# Patient Record
Sex: Male | Born: 1985 | Race: White | Hispanic: No | Marital: Single | Smoking: Current every day smoker
Health system: Southern US, Community
[De-identification: ages and names within clinical notes are randomized; demographics above are authoritative.]

## PROBLEM LIST (undated history)

## (undated) DIAGNOSIS — F6381 Intermittent explosive disorder: Secondary | ICD-10-CM

---

## 2008-07-19 ENCOUNTER — Emergency Department (HOSPITAL_COMMUNITY): Admission: EM | Admit: 2008-07-19 | Discharge: 2008-07-19 | Payer: Self-pay | Admitting: Emergency Medicine

## 2008-10-20 ENCOUNTER — Emergency Department (HOSPITAL_COMMUNITY): Admission: EM | Admit: 2008-10-20 | Discharge: 2008-10-20 | Payer: Self-pay | Admitting: Emergency Medicine

## 2009-03-27 ENCOUNTER — Emergency Department (HOSPITAL_COMMUNITY): Admission: EM | Admit: 2009-03-27 | Discharge: 2009-03-27 | Payer: Self-pay | Admitting: Emergency Medicine

## 2010-02-01 ENCOUNTER — Emergency Department (HOSPITAL_COMMUNITY): Admission: EM | Admit: 2010-02-01 | Discharge: 2010-02-01 | Payer: Self-pay | Admitting: Emergency Medicine

## 2010-06-07 ENCOUNTER — Emergency Department (HOSPITAL_COMMUNITY): Admission: EM | Admit: 2010-06-07 | Discharge: 2010-06-07 | Payer: Self-pay | Admitting: Emergency Medicine

## 2010-06-13 ENCOUNTER — Emergency Department (HOSPITAL_COMMUNITY): Admission: EM | Admit: 2010-06-13 | Discharge: 2010-06-13 | Payer: Self-pay | Admitting: Emergency Medicine

## 2010-08-03 ENCOUNTER — Emergency Department (HOSPITAL_COMMUNITY): Admission: EM | Admit: 2010-08-03 | Discharge: 2010-08-03 | Payer: Self-pay | Admitting: Emergency Medicine

## 2010-09-17 ENCOUNTER — Emergency Department (HOSPITAL_COMMUNITY): Admission: EM | Admit: 2010-09-17 | Discharge: 2010-09-17 | Payer: Self-pay | Admitting: Emergency Medicine

## 2010-10-08 ENCOUNTER — Emergency Department (HOSPITAL_COMMUNITY)
Admission: EM | Admit: 2010-10-08 | Discharge: 2010-10-08 | Payer: Self-pay | Source: Home / Self Care | Admitting: Emergency Medicine

## 2010-11-16 ENCOUNTER — Emergency Department (HOSPITAL_COMMUNITY)
Admission: EM | Admit: 2010-11-16 | Discharge: 2010-11-16 | Payer: Self-pay | Source: Home / Self Care | Admitting: Emergency Medicine

## 2010-11-29 ENCOUNTER — Ambulatory Visit (HOSPITAL_COMMUNITY)
Admission: RE | Admit: 2010-11-29 | Discharge: 2010-11-29 | Disposition: A | Discharge status: Home / Self Care | Payer: Self-pay | Source: Ambulatory Visit | Attending: Orthopedic Surgery | Admitting: Orthopedic Surgery

## 2010-11-29 DIAGNOSIS — X58XXXA Exposure to other specified factors, initial encounter: Secondary | ICD-10-CM | POA: Insufficient documentation

## 2010-11-29 DIAGNOSIS — S99919A Unspecified injury of unspecified ankle, initial encounter: Secondary | ICD-10-CM | POA: Insufficient documentation

## 2010-11-29 DIAGNOSIS — S8990XA Unspecified injury of unspecified lower leg, initial encounter: Secondary | ICD-10-CM | POA: Insufficient documentation

## 2010-11-29 DIAGNOSIS — M79609 Pain in unspecified limb: Secondary | ICD-10-CM | POA: Insufficient documentation

## 2010-12-01 ENCOUNTER — Other Ambulatory Visit (HOSPITAL_COMMUNITY): Payer: Self-pay | Admitting: Orthopedic Surgery

## 2010-12-01 DIAGNOSIS — M6740 Ganglion, unspecified site: Secondary | ICD-10-CM

## 2010-12-01 DIAGNOSIS — M79672 Pain in left foot: Secondary | ICD-10-CM

## 2010-12-06 ENCOUNTER — Ambulatory Visit (HOSPITAL_COMMUNITY)
Admission: RE | Admit: 2010-12-06 | Discharge: 2010-12-06 | Disposition: A | Discharge status: Home / Self Care | Payer: Self-pay | Source: Ambulatory Visit | Attending: Orthopedic Surgery | Admitting: Orthopedic Surgery

## 2010-12-06 DIAGNOSIS — M79672 Pain in left foot: Secondary | ICD-10-CM

## 2010-12-06 DIAGNOSIS — M6740 Ganglion, unspecified site: Secondary | ICD-10-CM

## 2010-12-06 DIAGNOSIS — R262 Difficulty in walking, not elsewhere classified: Secondary | ICD-10-CM | POA: Insufficient documentation

## 2010-12-06 DIAGNOSIS — M899 Disorder of bone, unspecified: Secondary | ICD-10-CM | POA: Insufficient documentation

## 2010-12-06 DIAGNOSIS — M79609 Pain in unspecified limb: Secondary | ICD-10-CM | POA: Insufficient documentation

## 2011-01-10 ENCOUNTER — Ambulatory Visit: Payer: Self-pay | Admitting: Family Medicine

## 2011-02-03 ENCOUNTER — Emergency Department (HOSPITAL_COMMUNITY)
Admission: EM | Admit: 2011-02-03 | Discharge: 2011-02-03 | Disposition: A | Discharge status: Home / Self Care | Payer: No Typology Code available for payment source | Attending: Emergency Medicine | Admitting: Emergency Medicine

## 2011-02-03 DIAGNOSIS — M545 Low back pain, unspecified: Secondary | ICD-10-CM | POA: Insufficient documentation

## 2011-02-03 DIAGNOSIS — S0990XA Unspecified injury of head, initial encounter: Secondary | ICD-10-CM | POA: Insufficient documentation

## 2011-02-03 DIAGNOSIS — R42 Dizziness and giddiness: Secondary | ICD-10-CM | POA: Insufficient documentation

## 2011-02-18 ENCOUNTER — Emergency Department (HOSPITAL_COMMUNITY)
Admission: EM | Admit: 2011-02-18 | Discharge: 2011-02-18 | Disposition: A | Discharge status: Home / Self Care | Payer: Self-pay | Attending: Emergency Medicine | Admitting: Emergency Medicine

## 2011-02-18 ENCOUNTER — Emergency Department (HOSPITAL_COMMUNITY): Payer: Self-pay

## 2011-02-18 DIAGNOSIS — S93409A Sprain of unspecified ligament of unspecified ankle, initial encounter: Secondary | ICD-10-CM | POA: Insufficient documentation

## 2011-02-18 DIAGNOSIS — X500XXA Overexertion from strenuous movement or load, initial encounter: Secondary | ICD-10-CM | POA: Insufficient documentation

## 2011-04-02 ENCOUNTER — Emergency Department (HOSPITAL_COMMUNITY)
Admission: EM | Admit: 2011-04-02 | Discharge: 2011-04-03 | Disposition: A | Discharge status: Home / Self Care | Payer: No Typology Code available for payment source | Attending: Emergency Medicine | Admitting: Emergency Medicine

## 2011-04-02 ENCOUNTER — Emergency Department (HOSPITAL_COMMUNITY): Payer: Self-pay

## 2011-04-02 DIAGNOSIS — M7989 Other specified soft tissue disorders: Secondary | ICD-10-CM | POA: Insufficient documentation

## 2011-04-02 DIAGNOSIS — S6990XA Unspecified injury of unspecified wrist, hand and finger(s), initial encounter: Secondary | ICD-10-CM | POA: Insufficient documentation

## 2011-04-02 DIAGNOSIS — W230XXA Caught, crushed, jammed, or pinched between moving objects, initial encounter: Secondary | ICD-10-CM | POA: Insufficient documentation

## 2011-04-02 DIAGNOSIS — S6390XA Sprain of unspecified part of unspecified wrist and hand, initial encounter: Secondary | ICD-10-CM | POA: Insufficient documentation

## 2011-04-02 DIAGNOSIS — M79609 Pain in unspecified limb: Secondary | ICD-10-CM | POA: Insufficient documentation

## 2011-04-02 DIAGNOSIS — K029 Dental caries, unspecified: Secondary | ICD-10-CM | POA: Insufficient documentation

## 2011-04-02 DIAGNOSIS — Y92009 Unspecified place in unspecified non-institutional (private) residence as the place of occurrence of the external cause: Secondary | ICD-10-CM | POA: Insufficient documentation

## 2011-05-24 ENCOUNTER — Emergency Department (HOSPITAL_COMMUNITY)
Admission: EM | Admit: 2011-05-24 | Discharge: 2011-05-25 | Disposition: A | Discharge status: Home / Self Care | Payer: Self-pay | Attending: Emergency Medicine | Admitting: Emergency Medicine

## 2011-05-24 DIAGNOSIS — R142 Eructation: Secondary | ICD-10-CM | POA: Insufficient documentation

## 2011-05-24 DIAGNOSIS — R109 Unspecified abdominal pain: Secondary | ICD-10-CM | POA: Insufficient documentation

## 2011-05-24 DIAGNOSIS — R141 Gas pain: Secondary | ICD-10-CM | POA: Insufficient documentation

## 2011-05-24 DIAGNOSIS — R112 Nausea with vomiting, unspecified: Secondary | ICD-10-CM | POA: Insufficient documentation

## 2011-05-24 LAB — BASIC METABOLIC PANEL
BUN: 9 mg/dL (ref 6–23)
CO2: 30 mEq/L (ref 19–32)
Calcium: 9.8 mg/dL (ref 8.4–10.5)
Creatinine, Ser: 0.8 mg/dL (ref 0.50–1.35)
GFR calc non Af Amer: >60 mL/min (ref >60)
Glucose, Bld: 94 mg/dL (ref 70–99)
Sodium: 142 mEq/L (ref 135–145)

## 2011-05-24 LAB — DIFFERENTIAL
Basophils Relative: 0 % (ref 0–1)
Lymphs Abs: 3.1 10*3/uL (ref 0.7–4.0)
Monocytes Absolute: 1.2 10*3/uL — ABNORMAL HIGH (ref 0.1–1.0)
Monocytes Relative: 10 % (ref 3–12)
Neutro Abs: 7.3 10*3/uL (ref 1.7–7.7)

## 2011-05-24 LAB — CBC
Hemoglobin: 14.5 g/dL (ref 13.0–17.0)
MCH: 27.3 pg (ref 26.0–34.0)
MCHC: 34.4 g/dL (ref 30.0–36.0)
MCV: 79.5 fL (ref 78.0–100.0)
RBC: 5.31 MIL/uL (ref 4.22–5.81)

## 2011-05-25 ENCOUNTER — Emergency Department (HOSPITAL_COMMUNITY): Payer: Self-pay

## 2011-05-25 LAB — URINALYSIS, ROUTINE W REFLEX MICROSCOPIC
Bilirubin Urine: NEGATIVE
Glucose, UA: NEGATIVE mg/dL
Hgb urine dipstick: NEGATIVE
Ketones, ur: NEGATIVE mg/dL
Protein, ur: NEGATIVE mg/dL
Urobilinogen, UA: 1 mg/dL (ref 0.0–1.0)

## 2011-05-25 MED ORDER — IOHEXOL 300 MG/ML  SOLN
100.0000 mL | Freq: Once | INTRAMUSCULAR | Status: AC | PRN
  Administered 2011-05-25: 100 mL via INTRAVENOUS

## 2011-07-28 LAB — URINE MICROSCOPIC-ADD ON

## 2011-07-28 LAB — URINALYSIS, ROUTINE W REFLEX MICROSCOPIC
Bilirubin Urine: NEGATIVE
Glucose, UA: NEGATIVE mg/dL
Ketones, ur: NEGATIVE mg/dL
Leukocytes, UA: NEGATIVE
Protein, ur: 30 mg/dL — AB
pH: 6.5 (ref 5.0–8.0)

## 2012-01-22 ENCOUNTER — Encounter (HOSPITAL_COMMUNITY): Payer: Self-pay | Admitting: *Deleted

## 2012-01-22 ENCOUNTER — Emergency Department (HOSPITAL_COMMUNITY): Payer: Self-pay

## 2012-01-22 ENCOUNTER — Emergency Department (HOSPITAL_COMMUNITY)
Admission: EM | Admit: 2012-01-22 | Discharge: 2012-01-22 | Disposition: A | Discharge status: Home / Self Care | Payer: Self-pay | Attending: Internal Medicine | Admitting: Internal Medicine

## 2012-01-22 DIAGNOSIS — M549 Dorsalgia, unspecified: Secondary | ICD-10-CM | POA: Insufficient documentation

## 2012-01-22 DIAGNOSIS — M62838 Other muscle spasm: Secondary | ICD-10-CM | POA: Insufficient documentation

## 2012-01-22 DIAGNOSIS — M25519 Pain in unspecified shoulder: Secondary | ICD-10-CM | POA: Insufficient documentation

## 2012-01-22 MED ORDER — IBUPROFEN 800 MG PO TABS
800.0000 mg | ORAL_TABLET | Freq: Once | ORAL | Status: AC
  Administered 2012-01-22: 800 mg via ORAL
  Filled 2012-01-22: qty 1

## 2012-01-22 MED ORDER — CYCLOBENZAPRINE HCL 10 MG PO TABS: 10.0000 mg | ORAL_TABLET | Freq: Two times a day (BID) | ORAL | Status: AC | PRN

## 2012-01-22 MED ORDER — IBUPROFEN 800 MG PO TABS: 800.0000 mg | ORAL_TABLET | Freq: Three times a day (TID) | ORAL | Status: AC

## 2012-01-22 MED ORDER — CYCLOBENZAPRINE HCL 10 MG PO TABS
10.0000 mg | ORAL_TABLET | Freq: Once | ORAL | Status: AC
  Administered 2012-01-22: 10 mg via ORAL
  Filled 2012-01-22: qty 1

## 2012-01-22 NOTE — ED Provider Notes (Signed)
History     CSN: [id number]  Arrival date & time 01/22/12  1646   None     Chief Complaint  Patient presents with  . Shoulder Pain    (Consider location/radiation/quality/duration/timing/severity/associated sxs/prior treatment) HPI  History reviewed. No pertinent past medical history.  History reviewed. No pertinent past surgical history.  No family history on file.  History  Substance Use Topics  . Smoking status: Current Everyday Smoker  . Smokeless tobacco: Not on file  . Alcohol Use: Yes      Review of Systems  Allergies  Review of patient's allergies indicates no known allergies.  Home Medications  No current outpatient prescriptions on file.  BP 139/83  Pulse 79  Temp(Src) 98.3 F (36.8 C) (Oral)  Resp 18  SpO2 98%  Physical Exam  ED Course  Procedures (including critical care time)  Labs Reviewed - No data to display No results found.   No diagnosis found.           Toy Baker, MD 01/24/12 [phone]

## 2012-01-22 NOTE — ED Notes (Signed)
Pt stated that he has been having left shoulder and back pain x 2 days. Pt is tender to touch in back area. No redness or swelling.  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 No respiratory or cardiac distress. Will continue to monitor.

## 2012-01-22 NOTE — Discharge Instructions (Signed)
Mr Kamara the x-ray of your shoulder did not show any acute fracture. We gave you a muscle relaxer or an ibuprofen in the ER. You can also use ice to the area x 24 hours intermittantly.  It PCP from the list below to followup with or get one on your own.  Return to the ER for severe pain or severe shortness of breath.  Back Pain, Adult Back pain is very common. The pain often gets better over time. The cause of back pain is usually not dangerous. Most people can learn to manage their back pain on their own.  HOME CARE   Stay active. Start with short walks on flat ground if you can. Try to walk farther each day.   Do not sit, drive, or stand in one place for more than 30 minutes. Do not stay in bed.   Do not avoid exercise or work. Activity can help your back heal faster.   Be careful when you bend or lift an object. Bend at your knees, keep the object close to you, and do not twist.   Sleep on a firm mattress. Lie on your side, and bend your knees. If you lie on your back, put a pillow under your knees.   Only take medicines as told by your doctor.   Put ice on the injured area.   Put ice in a plastic bag.   Place a towel between your skin and the bag.   Leave the ice on for 15 to 20 minutes, 3 to 4 times a day for the first 2 to 3 days. After that, you can switch between ice and heat packs.   Ask your doctor about back exercises or massage.   Avoid feeling anxious or stressed. Find good ways to deal with stress, such as exercise.  GET HELP RIGHT AWAY IF:   Your pain does not go away with rest or medicine.   Your pain does not go away in 1 week.   You have new problems.   You do not feel well.   The pain spreads into your legs.   You cannot control when you poop (bowel movement) or pee (urinate).   Your arms or legs feel weak or lose feeling (numbness).   You feel sick to your stomach (nauseous) or throw up (vomit).   You have belly (abdominal) pain.   You feel like  you may pass out (faint).  MAKE SURE YOU:   Understand these instructions.   Will watch your condition.   Will get help right away if you are not doing well or get worse.  Document Released: 03/27/2008 Document Revised: 09/28/2011 Document Reviewed: 02/27/2011 [facility] Patient Information 2012 [place], [place].Muscle Cramps Muscle cramps are when muscles tighten by themselves. Muscle cramps usually improve or go away within minutes. HOME CARE  Massage the muscle.   Stretch the muscle.   Relax the muscle.   Only take medicine as told by your doctor.   Drink enough fluids to keep your pee (urine) clear or pale yellow.  GET HELP RIGHT AWAY IF:  Cramps are frequent and do not get better with medicine. MAKE SURE YOU:  Understand these intructions.   Will watch your condition.   Will get help right away if your are not doing well or get worse.  Document Released: 09/21/2008 Document Revised: 09/28/2011 Document Reviewed: 09/30/2008 [facility] Patient Information 2012 [place], [place].

## 2012-01-22 NOTE — ED Provider Notes (Signed)
History     CSN: [id number]  Arrival date & time 01/22/12  1646   None     Chief Complaint  Patient presents with  . Shoulder Pain    (Consider location/radiation/quality/duration/timing/severity/associated sxs/prior treatment) Patient is a 26 y.o. male presenting with shoulder pain. The history is provided by the patient. No language interpreter was used.  Shoulder Pain This is a new problem. The current episode started yesterday. The problem occurs constantly. The problem has been unchanged. Pertinent negatives include no chest pain, chills, coughing, fever, joint swelling, nausea, neck pain, numbness, rash, vomiting or weakness. The symptoms are aggravated by twisting and bending. He has tried acetaminophen for the symptoms. The treatment provided mild relief.  Reports that he awoke with L scapula pain yesterday and it is getting progressively worse.  Denies injury.  Mover for occupation.  Took tylenol without relief.  No fever or chest pain.  No pcp. No pmh.  Smoker.  History reviewed. No pertinent past medical history.  History reviewed. No pertinent past surgical history.  No family history on file.  History  Substance Use Topics  . Smoking status: Current Everyday Smoker  . Smokeless tobacco: Not on file  . Alcohol Use: Yes      Review of Systems  Constitutional: Negative.  Negative for fever and chills.  HENT: Negative.  Negative for neck pain.   Eyes: Negative.   Respiratory: Negative.  Negative for cough and shortness of breath.   Cardiovascular: Negative.  Negative for chest pain.  Gastrointestinal: Negative.  Negative for nausea and vomiting.  Musculoskeletal: Positive for back pain. Negative for joint swelling.  Skin: Negative for rash.  Neurological: Negative.  Negative for dizziness, weakness, light-headedness and numbness.  Psychiatric/Behavioral: Negative.     Allergies  Review of patient's allergies indicates no known allergies.  Home Medications    No current outpatient prescriptions on file.  BP 139/83  Pulse 79  Temp(Src) 98.3 F (36.8 C) (Oral)  Resp 18  SpO2 98%  Physical Exam  Nursing note and vitals reviewed. Constitutional: He is oriented to person, place, and time. He appears well-developed and well-nourished.  HENT:  Head: Normocephalic.  Eyes: Conjunctivae and EOM are normal. Pupils are equal, round, and reactive to light.  Neck: Normal range of motion. Neck supple.  Cardiovascular: Normal rate.   Pulmonary/Chest: Effort normal.  Abdominal: Soft.  Musculoskeletal: Normal range of motion. He exhibits tenderness.       Muscle spasm palpated to the L scapula  Neurological: He is alert and oriented to person, place, and time.  Skin: Skin is warm and dry.  Psychiatric: He has a normal mood and affect.    ED Course  Procedures (including critical care time)  Labs Reviewed - No data to display Dg Shoulder Left  01/22/2012  *RADIOLOGY REPORT*  Clinical Data: Left shoulder pain.  LEFT SHOULDER - 2+ VIEW  Comparison: None  Findings: The joint spaces are maintained.  No acute fracture.  No abnormal soft tissue calcifications.  The left lung apex is clear. The left upper ribs are intact.  IMPRESSION: No acute bony findings.  Original Report Authenticated By: P. Loralie Champagne, M.D.     No diagnosis found.    MDM  L scapula muscle spasm treated with ice,  ibuprofen and flexeril.  Rx for the same.  No pcp.  Pick pcp from list.  Instructed to Return if SOB or dizziness.         Jethro Bastos, NP 01/23/12  1023 

## 2012-01-22 NOTE — ED Notes (Signed)
The pt has lt posterior shoulder pain .  He woke up with it yesterday am with the pain.  No previous history.  No known injury

## 2012-01-24 NOTE — ED Provider Notes (Signed)
Medical screening examination/treatment/procedure(s) were performed by non-physician practitioner and as supervising physician I was immediately available for consultation/collaboration.   Shelda Jakes, MD 01/24/12 [phone]

## 2013-08-12 ENCOUNTER — Encounter (HOSPITAL_COMMUNITY): Payer: Self-pay | Admitting: Emergency Medicine

## 2013-08-12 ENCOUNTER — Emergency Department (HOSPITAL_COMMUNITY)
Admission: EM | Admit: 2013-08-12 | Discharge: 2013-08-12 | Disposition: A | Discharge status: Home / Self Care | Payer: Self-pay | Attending: Emergency Medicine | Admitting: Emergency Medicine

## 2013-08-12 ENCOUNTER — Emergency Department (HOSPITAL_COMMUNITY): Payer: Self-pay

## 2013-08-12 DIAGNOSIS — S60229A Contusion of unspecified hand, initial encounter: Secondary | ICD-10-CM | POA: Insufficient documentation

## 2013-08-12 DIAGNOSIS — S6000XA Contusion of unspecified finger without damage to nail, initial encounter: Secondary | ICD-10-CM | POA: Insufficient documentation

## 2013-08-12 DIAGNOSIS — Y9389 Activity, other specified: Secondary | ICD-10-CM | POA: Insufficient documentation

## 2013-08-12 DIAGNOSIS — Y9289 Other specified places as the place of occurrence of the external cause: Secondary | ICD-10-CM | POA: Insufficient documentation

## 2013-08-12 DIAGNOSIS — W010XXA Fall on same level from slipping, tripping and stumbling without subsequent striking against object, initial encounter: Secondary | ICD-10-CM | POA: Insufficient documentation

## 2013-08-12 DIAGNOSIS — F17200 Nicotine dependence, unspecified, uncomplicated: Secondary | ICD-10-CM | POA: Insufficient documentation

## 2013-08-12 MED ORDER — HYDROCODONE-ACETAMINOPHEN 5-325 MG PO TABS: 1.0000 | ORAL_TABLET | Freq: Four times a day (QID) | ORAL | Status: DC | PRN

## 2013-08-12 MED ORDER — IBUPROFEN 800 MG PO TABS: 800.0000 mg | ORAL_TABLET | Freq: Three times a day (TID) | ORAL | Status: DC | PRN

## 2013-08-12 NOTE — ED Provider Notes (Signed)
CSN: [id number]     Arrival date & time 08/12/13  1316 History  This chart was scribed for Tyler Ridge, PA-C working with Flint Melter, MD by Ashley Jacobs, ED scribe. This patient was seen in room WTR6/WTR6 and the patient's care was started at 1:35 PM.  First MD Initiated Contact with Patient 08/12/13 1318     Chief Complaint  Patient presents with  . Wrist Pain   (Consider location/radiation/quality/duration/timing/severity/associated sxs/prior Treatment) The history is provided by the patient and medical records. No language interpreter was used.   HPI Comments: Tyler Ryan is a 27 y.o. male who presents to the Emergency Department complaining of constant, moderate, right wrist pain that occurred yesterday after falling while taking the out garbage. Pt took Ibuprofen last night for pain but reports the pain has not resolved. He is also experiencing gradually worsening swelling in his left hand and abrasion on his 3-5th interphalangeal joints. He states this morning he was unable to grasp his coffee cup this morning. Pt denies weakness and numbness. His last tetanus shot was 2010. He does not have any known allergies or any previous medical complication. Pt smoke tobacco everyday and uses alcohol  No past medical history on file. No past surgical history on file. No family history on file. History  Substance Use Topics  . Smoking status: Current Every Day Smoker  . Smokeless tobacco: Not on file  . Alcohol Use: Yes    Review of Systems  Musculoskeletal: Positive for arthralgias and joint swelling.       Right wrist   Skin: Positive for wound (3-5th digit).  Neurological: Negative for weakness and numbness.    Allergies  Review of patient's allergies indicates no known allergies.  Home Medications   Current Outpatient Rx  Name  Route  Sig  Dispense  Refill  . ibuprofen (ADVIL,MOTRIN) 200 MG tablet   Oral   Take 400 mg by mouth every 6 (six) hours as needed for pain  (pain).         . Zinc Oxide 10 % OINT   Apply externally   Apply 1 application topically as needed (placed on abrasions).          BP 124/80  Pulse 90  Temp(Src) 98.5 F (36.9 C) (Oral)  SpO2 98% Physical Exam  Nursing note and vitals reviewed. Constitutional: He is oriented to person, place, and time. He appears well-developed and well-nourished.  Musculoskeletal:       Right wrist: He exhibits decreased range of motion, tenderness and bony tenderness. He exhibits no deformity.       Right hand: He exhibits decreased range of motion, tenderness and swelling. He exhibits normal two-point discrimination, normal capillary refill and no deformity. Normal sensation noted.       Hands: Neurological: He is alert and oriented to person, place, and time.  Skin: Skin is warm and dry.    ED Course  Procedures (including critical care time) DIAGNOSTIC STUDIES: Oxygen Saturation is 98% on room air, normal by my interpretation.    COORDINATION OF CARE: 1:39 PM Discussed course of care with pt which includes DG Complete R hand. Pt understands and agrees.  Patient does not have any fractures noted on x-ray.  Patient be referred to hand surgery for further evaluation and followup.  Patient be placed in a Watson-Jones dressing for protection and comfort   I personally performed the services described in this documentation, which was scribed in my presence. The recorded information has  been reviewed and is accurate.   Carlyle Dolly, PA-C 08/12/13 [phone]

## 2013-08-12 NOTE — Progress Notes (Signed)
[facility] provided pt with a list of primary care resources and a [facility] Card application.  °

## 2013-08-12 NOTE — ED Provider Notes (Signed)
Medical screening examination/treatment/procedure(s) were performed by non-physician practitioner and as supervising physician I was immediately available for consultation/collaboration.   Flint Melter, MD 08/12/13 [phone]

## 2013-08-12 NOTE — ED Notes (Signed)
Patient reports a trip and fall on 08/11/13, trying to break the fall with hands. C/o pain and swelling to right wrist.  patients denies LOC, dizziness or nausea.

## 2013-08-26 ENCOUNTER — Emergency Department (HOSPITAL_COMMUNITY)
Admission: EM | Admit: 2013-08-26 | Discharge: 2013-08-26 | Disposition: A | Discharge status: Home / Self Care | Payer: Self-pay | Attending: Emergency Medicine | Admitting: Emergency Medicine

## 2013-08-26 ENCOUNTER — Encounter (HOSPITAL_COMMUNITY): Payer: Self-pay | Admitting: Emergency Medicine

## 2013-08-26 ENCOUNTER — Emergency Department (HOSPITAL_COMMUNITY): Payer: Self-pay

## 2013-08-26 DIAGNOSIS — M25439 Effusion, unspecified wrist: Secondary | ICD-10-CM | POA: Insufficient documentation

## 2013-08-26 DIAGNOSIS — S6391XA Sprain of unspecified part of right wrist and hand, initial encounter: Secondary | ICD-10-CM

## 2013-08-26 DIAGNOSIS — S63501A Unspecified sprain of right wrist, initial encounter: Secondary | ICD-10-CM

## 2013-08-26 DIAGNOSIS — M25539 Pain in unspecified wrist: Secondary | ICD-10-CM | POA: Insufficient documentation

## 2013-08-26 DIAGNOSIS — F17200 Nicotine dependence, unspecified, uncomplicated: Secondary | ICD-10-CM | POA: Insufficient documentation

## 2013-08-26 DIAGNOSIS — G8911 Acute pain due to trauma: Secondary | ICD-10-CM | POA: Insufficient documentation

## 2013-08-26 MED ORDER — NAPROXEN 500 MG PO TABS: 500.0000 mg | ORAL_TABLET | Freq: Two times a day (BID) | ORAL | Status: DC

## 2013-08-26 NOTE — ED Provider Notes (Signed)
CSN: [id number]     Arrival date & time 08/26/13  1557 History  This chart was scribed for non-physician practitioner working with Derwood Kaplan, MD by Ronal Fear, ED scribe. This patient was seen in room TR05C/TR05C and the patient's care was started at 4:45 PM.  Chief Complaint  Patient presents with  . Hand Injury   Patient is a 27 y.o. male presenting with hand injury. The history is provided by the patient. No language interpreter was used.  Hand Injury Location:  Wrist and hand Wrist location:  R wrist Hand location:  R hand Pain details:    Radiates to:  R forearm and R fingers   Severity:  Mild   Onset quality:  Sudden   Duration:  2 weeks   Timing:  Constant   Progression:  Worsening Chronicity:  New Relieved by:  Nothing Worsened by:  Movement Associated symptoms: no decreased range of motion, no numbness, no swelling and no tingling    HPI Comments: Tyler Ryan is a 27 y.o. male who presents to the Emergency Department complaining of right wrist pain onset 2x weeks ago after he fell off his porch taking out trash and injured his right hand. Pt was seen at [facility] and was told there were no remarkable results. He presents today because the pain has grown worse over the past several days.  He has been trying to lift heavy objects. He does not appear to be in any acute distress with no other complaints.  History reviewed. No pertinent past medical history. History reviewed. No pertinent past surgical history. No family history on file. History  Substance Use Topics  . Smoking status: Current Every Day Smoker  . Smokeless tobacco: Not on file  . Alcohol Use: Yes    Review of Systems  Musculoskeletal: Positive for arthralgias and joint swelling.  Neurological: Negative for weakness and numbness.  All other systems reviewed and are negative.    Allergies  Review of patient's allergies indicates no known allergies.  Home Medications   Current Outpatient Rx   Name  Route  Sig  Dispense  Refill  . HYDROcodone-acetaminophen (NORCO/VICODIN) 5-325 MG per tablet   Oral   Take 1 tablet by mouth every 6 (six) hours as needed for pain.   15 tablet   0   . ibuprofen (ADVIL,MOTRIN) 200 MG tablet   Oral   Take 400 mg by mouth every 6 (six) hours as needed for pain (pain).         Marland Kitchen ibuprofen (ADVIL,MOTRIN) 800 MG tablet   Oral   Take 1 tablet (800 mg total) by mouth every 8 (eight) hours as needed for pain.   21 tablet   0   . Zinc Oxide 10 % OINT   Apply externally   Apply 1 application topically as needed (placed on abrasions).          BP 140/96  Pulse 91  Temp(Src) 98.6 F (37 C) (Oral)  Resp 20  SpO2 97% Physical Exam  Nursing note and vitals reviewed. Constitutional: He is oriented to person, place, and time. He appears well-developed and well-nourished. No distress.  HENT:  Head: Normocephalic.  Left Ear: External ear normal.  Eyes: Conjunctivae are normal.  Neck: Normal range of motion. Neck supple.  Cardiovascular: Normal rate, regular rhythm and normal heart sounds.   Pulmonary/Chest: Effort normal and breath sounds normal. No respiratory distress. He has no wheezes. He has no rales.  Abdominal: There is no tenderness.  Musculoskeletal: He exhibits no edema.  Normal appearing hand. tenderness over dorsal wrist and 4th and 5th MC and MCP joints. Full ROM of all fingers. Pain when making fist. Pain with Flexion and extenstion of the wrist, full ROM of the wrist  Lymphadenopathy:    He has no cervical adenopathy.  Neurological: He is alert and oriented to person, place, and time.  Skin: Skin is warm and dry. No erythema.  Psychiatric: He has a normal mood and affect.    ED Course  Procedures (including critical care time) DIAGNOSTIC STUDIES: Oxygen Saturation is 97% on RA, adequate  by my interpretation.    COORDINATION OF CARE:    4:48 PM- Pt advised of plan for treatment including x-ray of wrist and hand and pt  agrees.  Labs Review Labs Reviewed - No data to display Imaging Review Dg Wrist Complete Right  08/26/2013   CLINICAL DATA:  Fall, wrist pain  EXAM: RIGHT WRIST - COMPLETE 3+ VIEW  COMPARISON:  DG HAND COMPLETE*R* dated 08/26/2013  FINDINGS: No distal radius or ulnar fracture. Radiocarpal joint is intact. No carpal fracture. No soft tissue abnormality.  IMPRESSION: No acute osseous abnormality.   Electronically Signed   By: Genevive Bi M.D.   On: 08/26/2013 18:13   Dg Hand Complete Right  08/26/2013   CLINICAL DATA:  Pain post trauma  EXAM: RIGHT HAND - COMPLETE 3+ VIEW  COMPARISON:  August 12, 2013  FINDINGS: Frontal, oblique, and lateral views were obtained. There is remodeling in the distal 5th metacarpal consistent with previous fracture in this area. There is currently no acute fracture or dislocation. Joint spaces appear intact. No erosive change.  IMPRESSION: Old trauma distal 5th metacarpal. No acute fracture or dislocation. No appreciable arthropathic change.   Electronically Signed   By: Bretta Bang M.D.   On: 08/26/2013 17:53    EKG Interpretation   None       MDM   1. Hand sprain, right, initial encounter   2. Wrist sprain, right, initial encounter     Patient with right wrist and hand pain for 2 weeks after injury. 3 x-ray today and x-rays are negative for any acute injury. It is possible he has a hand sprain. Will continue on anti-inflammatories. I did provide him with a Velcro wrist splint for immobilization. I do not see any evidence of any tendon or nerve injuries. He has full range of motion of all the fingers and full-strength. He will need followup as needed.   I personally performed the services described in this documentation, which was scribed in my presence. The recorded information has been reviewed and is accurate.   Lottie Mussel, PA-C 08/26/13 2351  Lottie Mussel, PA-C 08/26/13 2352

## 2013-08-26 NOTE — ED Notes (Signed)
Pt is here to have his right hand re-checked.  He was seen at [facility] ED on 10/21 and was told to follow up with Dr. Cliffton Asters.  Due to insurance issues pt is here to see Dr. Cliffton Asters (per his office he was told to come here to see him?).  Pt states that the hand still hurts, no numbness, pt is able to move fingers

## 2013-08-30 NOTE — ED Provider Notes (Signed)
Medical screening examination/treatment/procedure(s) were performed by non-physician practitioner and as supervising physician I was immediately available for consultation/collaboration.  EKG Interpretation   None        Christophr Calix, MD 08/30/13 0739 

## 2014-02-11 ENCOUNTER — Emergency Department (HOSPITAL_COMMUNITY)
Admission: EM | Admit: 2014-02-11 | Discharge: 2014-02-11 | Disposition: A | Discharge status: Home / Self Care | Payer: Self-pay | Attending: Emergency Medicine | Admitting: Emergency Medicine

## 2014-02-11 ENCOUNTER — Encounter (HOSPITAL_COMMUNITY): Payer: Self-pay | Admitting: Emergency Medicine

## 2014-02-11 ENCOUNTER — Emergency Department (HOSPITAL_COMMUNITY): Payer: Self-pay

## 2014-02-11 DIAGNOSIS — X58XXXA Exposure to other specified factors, initial encounter: Secondary | ICD-10-CM | POA: Insufficient documentation

## 2014-02-11 DIAGNOSIS — Y929 Unspecified place or not applicable: Secondary | ICD-10-CM | POA: Insufficient documentation

## 2014-02-11 DIAGNOSIS — F17200 Nicotine dependence, unspecified, uncomplicated: Secondary | ICD-10-CM | POA: Insufficient documentation

## 2014-02-11 DIAGNOSIS — S46919A Strain of unspecified muscle, fascia and tendon at shoulder and upper arm level, unspecified arm, initial encounter: Secondary | ICD-10-CM

## 2014-02-11 DIAGNOSIS — Y939 Activity, unspecified: Secondary | ICD-10-CM | POA: Insufficient documentation

## 2014-02-11 DIAGNOSIS — Z791 Long term (current) use of non-steroidal anti-inflammatories (NSAID): Secondary | ICD-10-CM | POA: Insufficient documentation

## 2014-02-11 DIAGNOSIS — IMO0002 Reserved for concepts with insufficient information to code with codable children: Secondary | ICD-10-CM | POA: Insufficient documentation

## 2014-02-11 MED ORDER — HYDROCODONE-ACETAMINOPHEN 5-325 MG PO TABS: 1.0000 | ORAL_TABLET | Freq: Four times a day (QID) | ORAL | Status: DC | PRN

## 2014-02-11 NOTE — ED Provider Notes (Signed)
CSN: [id number]     Arrival date & time 02/11/14  1717 History   This chart was scribed for non-physician practitioner, Teressa LowerVrinda Seleni Meller, NP, working with Raeford RazorStephen Kohut, MD by Charline BillsEssence Howell, ED Scribe. This patient was seen in room WTR5/WTR5 and the patient's care was started at 5:24 PM.    Chief Complaint  Patient presents with  . Shoulder Pain    HPI HPI Comments: Tyler Ryan is a 28 y.o. male who presents to the Emergency Department complaining of R shoulder pain onset 2 weeks ago. Pt denies injury. He states that he woke up one morning with this pain. He also reports decreased ROM due to severity of pain. 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  No past medical history on file. No past surgical history on file. No family history on file. History  Substance Use Topics  . Smoking status: Current Every Day Smoker  . Smokeless tobacco: Not on file  . Alcohol Use: Yes    Review of Systems  Musculoskeletal: Positive for arthralgias.  All other systems reviewed and are negative.   Allergies  Review of patient's allergies indicates no known allergies.  Home Medications   Prior to Admission medications   Medication Sig Start Date End Date Taking? Authorizing Provider  HYDROcodone-acetaminophen (NORCO/VICODIN) 5-325 MG per tablet Take 1 tablet by mouth every 6 (six) hours as needed for pain. 08/12/13   Jamesetta Orleanshristopher W Lawyer, PA-C  ibuprofen (ADVIL,MOTRIN) 800 MG tablet Take 800 mg by mouth every 8 (eight) hours as needed (for inflamation).    Historical Provider, MD  naproxen (NAPROSYN) 500 MG tablet Take 1 tablet (500 mg total) by mouth 2 (two) times daily. 08/26/13   Tatyana A Kirichenko, PA-C  Zinc Oxide 10 % OINT Apply 1 application topically as needed (placed on abrasions).    Historical Provider, MD   Triage Vitals: BP 141/85  Pulse 75  Temp(Src) 98.2 F (36.8 C) (Oral)  Resp 16  SpO2 99% Physical Exam  Nursing note and vitals  reviewed. Constitutional: He is oriented to person, place, and time. He appears well-developed and well-nourished.  HENT:  Head: Normocephalic and atraumatic.  Eyes: EOM are normal.  Neck: Neck supple.  Cardiovascular: Normal rate.   Pulmonary/Chest: Effort normal.  Musculoskeletal: Normal range of motion. He exhibits tenderness.  Neurological: He is alert and oriented to person, place, and time.  Skin: Skin is warm and dry.  Psychiatric: He has a normal mood and affect. His behavior is normal.    ED Course  Procedures (including critical care time) DIAGNOSTIC STUDIES: Oxygen Saturation is 99% on RA, normal by my interpretation.    COORDINATION OF CARE:  5:26 PM-Discussed treatment plan which includes XR with pt at bedside and pt agreed to plan.   Labs Review Labs Reviewed - No data to display  Imaging Review Dg Shoulder Right  02/11/2014   CLINICAL DATA:  SHOULDER PAIN  EXAM: RIGHT SHOULDER - 2+ VIEW  COMPARISON:  None.  FINDINGS: There is no evidence of fracture or dislocation. There is no evidence of arthropathy or other focal bone abnormality. Soft tissues are unremarkable.  IMPRESSION: Negative.   Electronically Signed   By: Salome HolmesHector  Cooper M.D.   On: 02/11/2014 18:05     EKG Interpretation None      MDM   Final diagnoses:  Shoulder strain    No acute bony abnormality noted:pt is okay to follow up as needed  I personally performed  the services described in this documentation, which was scribed in my presence. The recorded information has been reviewed and is accurate.    Teressa LowerVrinda Joann Jorge, NP 02/11/14 1825  Teressa LowerVrinda Zareth Rippetoe, NP 02/11/14 [phone]

## 2014-02-11 NOTE — ED Notes (Signed)
Pt states he has taken Tylenol for pain, but it is not helping much.

## 2014-02-11 NOTE — ED Notes (Signed)
Pt states his R shoulder is painful and has limited movement. States his R arm gets painful when he moves his shoulder. Pt states he woke up 2 weeks ago and his shoulder has been like this ever since. ROM decreased due to pain.

## 2014-02-11 NOTE — ED Provider Notes (Signed)
Medical screening examination/treatment/procedure(s) were performed by non-physician practitioner and as supervising physician I was immediately available for consultation/collaboration.   EKG Interpretation None       Tyler Woolum, MD 02/11/14 2325 

## 2014-02-11 NOTE — Discharge Instructions (Signed)
Muscle Strain  A muscle strain (pulled muscle) happens when a muscle is stretched beyond normal length. It happens when a sudden, violent force stretches your muscle too far. Usually, a few of the fibers in your muscle are torn. Muscle strain is common in athletes. Recovery usually takes 1 2 weeks. Complete healing takes 5 6 weeks.   HOME CARE    Follow the PRICE method of treatment to help your injury get better. Do this the first 2 3 days after the injury:   Protect. Protect the muscle to keep it from getting injured again.   Rest. Limit your activity and rest the injured body part.   Ice. Put ice in a plastic bag. Place a towel between your skin and the bag. Then, apply the ice and leave it on from 15 20 minutes each hour. After the third day, switch to moist heat packs.   Compression. Use a splint or elastic bandage on the injured area for comfort. Do not put it on too tightly.   Elevate. Keep the injured body part above the level of your heart.   Only take medicine as told by your doctor.   Warm up before doing exercise to prevent future muscle strains.  GET HELP IF:    You have more pain or puffiness (swelling) in the injured area.   You feel numbness, tingling, or notice a loss of strength in the injured area.  MAKE SURE YOU:    Understand these instructions.   Will watch your condition.   Will get help right away if you are not doing well or get worse.  Document Released: 07/18/2008 Document Revised: 07/30/2013 Document Reviewed: 05/08/2013  [employer] Patient Information 2014 [employer].

## 2014-08-19 ENCOUNTER — Emergency Department (HOSPITAL_COMMUNITY)
Admission: EM | Admit: 2014-08-19 | Discharge: 2014-08-19 | Disposition: A | Discharge status: Home / Self Care | Payer: Self-pay | Attending: Emergency Medicine | Admitting: Emergency Medicine

## 2014-08-19 ENCOUNTER — Encounter (HOSPITAL_COMMUNITY): Payer: Self-pay | Admitting: Emergency Medicine

## 2014-08-19 ENCOUNTER — Emergency Department (HOSPITAL_COMMUNITY): Payer: Self-pay

## 2014-08-19 DIAGNOSIS — Z23 Encounter for immunization: Secondary | ICD-10-CM | POA: Insufficient documentation

## 2014-08-19 DIAGNOSIS — Z791 Long term (current) use of non-steroidal anti-inflammatories (NSAID): Secondary | ICD-10-CM | POA: Insufficient documentation

## 2014-08-19 DIAGNOSIS — Z720 Tobacco use: Secondary | ICD-10-CM | POA: Insufficient documentation

## 2014-08-19 DIAGNOSIS — S60511A Abrasion of right hand, initial encounter: Secondary | ICD-10-CM | POA: Insufficient documentation

## 2014-08-19 DIAGNOSIS — W19XXXA Unspecified fall, initial encounter: Secondary | ICD-10-CM

## 2014-08-19 DIAGNOSIS — S6991XA Unspecified injury of right wrist, hand and finger(s), initial encounter: Secondary | ICD-10-CM | POA: Insufficient documentation

## 2014-08-19 DIAGNOSIS — Y9289 Other specified places as the place of occurrence of the external cause: Secondary | ICD-10-CM | POA: Insufficient documentation

## 2014-08-19 DIAGNOSIS — R52 Pain, unspecified: Secondary | ICD-10-CM

## 2014-08-19 DIAGNOSIS — W1839XA Other fall on same level, initial encounter: Secondary | ICD-10-CM | POA: Insufficient documentation

## 2014-08-19 DIAGNOSIS — Y9389 Activity, other specified: Secondary | ICD-10-CM | POA: Insufficient documentation

## 2014-08-19 MED ORDER — HYDROCODONE-ACETAMINOPHEN 5-325 MG PO TABS
2.0000 | ORAL_TABLET | Freq: Once | ORAL | Status: AC
  Administered 2014-08-19: 2 via ORAL
  Filled 2014-08-19: qty 2

## 2014-08-19 MED ORDER — HYDROCODONE-ACETAMINOPHEN 5-325 MG PO TABS: 1.0000 | ORAL_TABLET | ORAL | Status: DC | PRN

## 2014-08-19 MED ORDER — TETANUS-DIPHTH-ACELL PERTUSSIS 5-2.5-18.5 LF-MCG/0.5 IM SUSP
0.5000 mL | Freq: Once | INTRAMUSCULAR | Status: AC
  Administered 2014-08-19: 0.5 mL via INTRAMUSCULAR
  Filled 2014-08-19: qty 0.5

## 2014-08-19 NOTE — ED Provider Notes (Signed)
Medical screening examination/treatment/procedure(s) were performed by non-physician practitioner and as supervising physician I was immediately available for consultation/collaboration.   EKG Interpretation None       Doug SouSam Jahleah Mariscal, MD 08/19/14 [phone]

## 2014-08-19 NOTE — ED Provider Notes (Signed)
CSN: [id number]     Arrival date & time 08/19/14  [id number] History  This chart was scribed for non-physician practitioner, Emilia BeckKaitlyn Carmellia Kreisler, PA-C working with Doug SouSam Jacubowitz, MD by Greggory StallionKayla Andersen, ED scribe. This patient was seen in room TR06C/TR06C and the patient's care was started at 10:38 AM.    Chief Complaint  Patient presents with  . Hand Pain   The history is provided by the patient. No language interpreter was used.   HPI Comments: Tyler Ryan is a [age] y.o. male who presents to the Emergency Department complaining of right hand injury that occurred 3 days ago. States he tripped over his dog, fell off the porch and landed on his hand. Reports right hand pain with associated swelling and abrasions. He has taken ibuprofen with no relief. Pressure and bending his fingers worsen the pain.   History reviewed. No pertinent past medical history. History reviewed. No pertinent past surgical history. No family history on file. History  Substance Use Topics  . Smoking status: Current Every Day Smoker -- 0.50 packs/day    Types: Cigarettes  . Smokeless tobacco: Not on file  . Alcohol Use: Yes    Review of Systems  Musculoskeletal: Positive for arthralgias and joint swelling.  Skin: Positive for wound.  All other systems reviewed and are negative.  Allergies  Review of patient's allergies indicates no known allergies.  Home Medications   Prior to Admission medications   Medication Sig Start Date End Date Taking? Authorizing Provider  HYDROcodone-acetaminophen (NORCO/VICODIN) 5-325 MG per tablet Take 1 tablet by mouth every 6 (six) hours as needed for pain. 08/12/13   Jamesetta Orleanshristopher W Lawyer, PA-C  HYDROcodone-acetaminophen (NORCO/VICODIN) 5-325 MG per tablet Take 1-2 tablets by mouth every 6 (six) hours as needed. 02/11/14   Teressa LowerVrinda Pickering, NP  ibuprofen (ADVIL,MOTRIN) 800 MG tablet Take 800 mg by mouth every 8 (eight) hours as needed (for inflamation).    Historical Provider, MD   naproxen (NAPROSYN) 500 MG tablet Take 1 tablet (500 mg total) by mouth 2 (two) times daily. 08/26/13   Tatyana A Kirichenko, PA-C  Zinc Oxide 10 % OINT Apply 1 application topically as needed (placed on abrasions).    Historical Provider, MD   BP 129/78  Pulse 77  Temp(Src) 97.9 F (36.6 C) (Oral)  Resp 22  Ht 5\' 11"  (1.803 m)  Wt 181 lb (82.101 kg)  BMI 25.26 kg/m2  SpO2 97%  Physical Exam  Nursing note and vitals reviewed. Constitutional: He is oriented to person, place, and time. He appears well-developed and well-nourished. No distress.  HENT:  Head: Normocephalic and atraumatic.  Eyes: Conjunctivae and EOM are normal.  Neck: Neck supple. No tracheal deviation present.  Cardiovascular: Normal rate.   Sufficient capillary refill of fingers of right hand.  Pulmonary/Chest: Effort normal. No respiratory distress.  Musculoskeletal: He exhibits edema and tenderness.  Multiple abrasions noted to dorsal MCPs of right hand. Generalized edema of right hand without obvious deformity. Slightly limited ROM of fingers due to pain.   Neurological: He is alert and oriented to person, place, and time.  Sensation intact of right hand.  Skin: Skin is warm and dry.  Psychiatric: He has a normal mood and affect. His behavior is normal.    ED Course  Procedures (including critical care time)  DIAGNOSTIC STUDIES: Oxygen Saturation is 97% on RA, normal by my interpretation.    COORDINATION OF CARE: 10:40 AM-Discussed treatment plan which includes ice, elevation and pain medication with pt at bedside  and pt agreed to plan.   Labs Review Labs Reviewed - No data to display  Imaging Review Dg Hand Complete Right  08/19/2014   CLINICAL DATA:  Patient fell off porch Sunday. Pain and right hand. Swelling. Initial evaluation.  EXAM: RIGHT HAND - COMPLETE 3+ VIEW  COMPARISON:  None.  FINDINGS: There is no evidence of fracture or dislocation. There is no evidence of arthropathy or other focal bone  abnormality. Soft tissues are unremarkable.  IMPRESSION: Negative.   Electronically Signed   By: Maisie Fushomas  Register   On: 08/19/2014 10:27     EKG Interpretation None      MDM   Final diagnoses:  Fall, initial encounter  Hand injury, right, initial encounter    10:44 AM Patient's xray unremarkable for acute changes. Patient instructed to rest, ice, and elevate the right hand. Patient will have Vicodin for pain. No neurovascular compromise noted. No other injury.   I personally performed the services described in this documentation, which was scribed in my presence. The recorded information has been reviewed and is accurate.  Emilia BeckKaitlyn Earland Reish, PA-C 08/19/14 1044

## 2014-08-19 NOTE — ED Notes (Signed)
Patient states he fell off his porch on Sunday and landed on his hand.   Patient states he tripped over his dog.   Abrasions and swelling to R hand.

## 2014-08-19 NOTE — Discharge Instructions (Signed)
Take Vicodin as needed for pain. Rest, ice, and elevate your right hand to reduce swelling. Refer to attached documents for more information.

## 2014-09-30 ENCOUNTER — Encounter (HOSPITAL_COMMUNITY): Payer: Self-pay | Admitting: *Deleted

## 2014-09-30 ENCOUNTER — Emergency Department (HOSPITAL_COMMUNITY)
Admission: EM | Admit: 2014-09-30 | Discharge: 2014-09-30 | Disposition: A | Discharge status: Home / Self Care | Payer: No Typology Code available for payment source | Attending: Emergency Medicine | Admitting: Emergency Medicine

## 2014-09-30 DIAGNOSIS — Y9389 Activity, other specified: Secondary | ICD-10-CM | POA: Insufficient documentation

## 2014-09-30 DIAGNOSIS — Z791 Long term (current) use of non-steroidal anti-inflammatories (NSAID): Secondary | ICD-10-CM | POA: Insufficient documentation

## 2014-09-30 DIAGNOSIS — W2201XS Walked into wall, sequela: Secondary | ICD-10-CM | POA: Insufficient documentation

## 2014-09-30 DIAGNOSIS — Z720 Tobacco use: Secondary | ICD-10-CM | POA: Insufficient documentation

## 2014-09-30 DIAGNOSIS — Y9289 Other specified places as the place of occurrence of the external cause: Secondary | ICD-10-CM | POA: Insufficient documentation

## 2014-09-30 DIAGNOSIS — L089 Local infection of the skin and subcutaneous tissue, unspecified: Secondary | ICD-10-CM

## 2014-09-30 DIAGNOSIS — Y998 Other external cause status: Secondary | ICD-10-CM | POA: Insufficient documentation

## 2014-09-30 DIAGNOSIS — S60512A Abrasion of left hand, initial encounter: Secondary | ICD-10-CM

## 2014-09-30 DIAGNOSIS — S60511S Abrasion of right hand, sequela: Secondary | ICD-10-CM | POA: Insufficient documentation

## 2014-09-30 MED ORDER — SULFAMETHOXAZOLE-TRIMETHOPRIM 800-160 MG PO TABS: 1.0000 | ORAL_TABLET | Freq: Two times a day (BID) | ORAL | Status: DC

## 2014-09-30 MED ORDER — HYDROCODONE-ACETAMINOPHEN 5-325 MG PO TABS
1.0000 | ORAL_TABLET | Freq: Once | ORAL | Status: AC
  Administered 2014-09-30: 1 via ORAL
  Filled 2014-09-30: qty 1

## 2014-09-30 MED ORDER — HYDROCODONE-ACETAMINOPHEN 5-325 MG PO TABS: 1.0000 | ORAL_TABLET | ORAL | Status: DC | PRN

## 2014-09-30 MED ORDER — CEPHALEXIN 500 MG PO CAPS: 500.0000 mg | ORAL_CAPSULE | Freq: Four times a day (QID) | ORAL | Status: DC

## 2014-09-30 MED ORDER — CEPHALEXIN 500 MG PO CAPS
500.0000 mg | ORAL_CAPSULE | Freq: Once | ORAL | Status: AC
  Administered 2014-09-30: 500 mg via ORAL
  Filled 2014-09-30: qty 1

## 2014-09-30 MED ORDER — SULFAMETHOXAZOLE-TRIMETHOPRIM 800-160 MG PO TABS
1.0000 | ORAL_TABLET | Freq: Once | ORAL | Status: AC
  Administered 2014-09-30: 1 via ORAL
  Filled 2014-09-30: qty 1

## 2014-09-30 NOTE — ED Notes (Signed)
Skin marked to monitor redness

## 2014-09-30 NOTE — Discharge Instructions (Signed)

## 2014-09-30 NOTE — ED Notes (Signed)
Pt repots R hand swelling/pain/redness since early this am.  Pt previous injury to R hand x 2 months ago.  Warm to touch.

## 2014-09-30 NOTE — ED Provider Notes (Signed)
CSN: [id number]     Arrival date & time 09/30/14  2036 History   None    Chief Complaint  Patient presents with  . Hand Pain   Patient is a 28 y.o. male presenting with hand pain. The history is provided by the patient. No language interpreter was used.  Hand Pain  This chart was scribed for non-physician practitioner Elpidio AnisShari Ashante Snelling, PA-C,  working with Arby BarretteMarcy Pfeiffer, MD, by Andrew Auaven Small, ED Scribe. This patient was seen in room WTR3/WLPT3 and the patient's care was started at 9:14 PM.  Lance CoonRobert Ohaver is a 28 y.o. male who presents to the Emergency Department complaining of right hand pain. Pt reports a right hand injury 2 months ago after punching a wall, and was [facility]. He now presents with recurrent right hand pain, swelling and redness that began this morning. Pt denies reinjury or falls. He denies since injury until this morning. Pt has been applying peroxide and abx ointment to wound. Pt states he is healthy otherwise.   History reviewed. No pertinent past medical history. History reviewed. No pertinent past surgical history. No family history on file. History  Substance Use Topics  . Smoking status: Current Every Day Smoker -- 0.50 packs/day    Types: Cigarettes  . Smokeless tobacco: Not on file  . Alcohol Use: Yes    Review of Systems  Skin: Positive for color change and wound.   Allergies  Review of patient's allergies indicates no known allergies.  Home Medications   Prior to Admission medications   Medication Sig Start Date End Date Taking? Authorizing Provider  HYDROcodone-acetaminophen (NORCO/VICODIN) 5-325 MG per tablet Take 1 tablet by mouth every 6 (six) hours as needed for pain. 08/12/13   Jamesetta Orleanshristopher W Lawyer, PA-C  HYDROcodone-acetaminophen (NORCO/VICODIN) 5-325 MG per tablet Take 1-2 tablets by mouth every 6 (six) hours as needed. 02/11/14   Teressa LowerVrinda Pickering, NP  HYDROcodone-acetaminophen (NORCO/VICODIN) 5-325 MG per tablet Take 1-2 tablets by  mouth every 4 (four) hours as needed for moderate pain or severe pain. 08/19/14   Kaitlyn Szekalski, PA-C  ibuprofen (ADVIL,MOTRIN) 800 MG tablet Take 800 mg by mouth every 8 (eight) hours as needed (for inflamation).    Historical Provider, MD  naproxen (NAPROSYN) 500 MG tablet Take 1 tablet (500 mg total) by mouth 2 (two) times daily. 08/26/13   Tatyana A Kirichenko, PA-C  Zinc Oxide 10 % OINT Apply 1 application topically as needed (placed on abrasions).    Historical Provider, MD   BP 152/83 mmHg  Pulse 78  Temp(Src) 98.7 F (37.1 C) (Oral)  Resp 18  Ht 5\' 11"  (1.803 m)  Wt 185 lb (83.915 kg)  BMI 25.81 kg/m2  SpO2 98% Physical Exam  Constitutional: He is oriented to person, place, and time. He appears well-developed and well-nourished. No distress.  HENT:  Head: Normocephalic and atraumatic.  Eyes: Conjunctivae and EOM are normal.  Neck: Neck supple.  Cardiovascular: Normal rate.   Pulmonary/Chest: Effort normal.  Musculoskeletal: Normal range of motion.  Right hand significantly swollen to dorsum. Old abrasion overlying 3rd MCP joint. No fluctuance. No drainage. Dorsal hand is erythematous with erythema extending to wrist. Significantly tender.   Neurological: He is alert and oriented to person, place, and time.  Skin: Skin is warm and dry.  Psychiatric: He has a normal mood and affect. His behavior is normal.  Nursing note and vitals reviewed.   ED Course  Procedures (including critical care time) DIAGNOSTIC STUDIES: Oxygen  Saturation is 98% on RA, normal by my interpretation.    COORDINATION OF CARE: 9:18 PM- Pt advised of plan for treatment and pt agrees.  Labs Review Labs Reviewed - No data to display  Imaging Review No results found.   EKG Interpretation None      MDM   Final diagnoses:  None    1. Right hand cellulitis  Afebrile, immunocompetent, non-toxic appearing patient with acute swelling of right hand with redness, without wound. Will start on  antibiotics and strongly encourage 2 day recheck in the ED. Patient agrees. Return precautions discussed.    I personally performed the services described in this documentation, which was scribed in my presence. The recorded information has been reviewed and is accurate.    Arnoldo HookerShari A Ahmed Inniss, PA-C 10/06/14 0036  Arby BarretteMarcy Pfeiffer, MD 10/06/14 [phone]

## 2014-10-02 ENCOUNTER — Emergency Department (HOSPITAL_COMMUNITY)
Admission: EM | Admit: 2014-10-02 | Discharge: 2014-10-02 | Disposition: A | Discharge status: Home / Self Care | Payer: No Typology Code available for payment source | Attending: Emergency Medicine | Admitting: Emergency Medicine

## 2014-10-02 ENCOUNTER — Encounter (HOSPITAL_COMMUNITY): Payer: Self-pay | Admitting: *Deleted

## 2014-10-02 DIAGNOSIS — Z720 Tobacco use: Secondary | ICD-10-CM | POA: Insufficient documentation

## 2014-10-02 DIAGNOSIS — Z791 Long term (current) use of non-steroidal anti-inflammatories (NSAID): Secondary | ICD-10-CM | POA: Insufficient documentation

## 2014-10-02 DIAGNOSIS — Z5189 Encounter for other specified aftercare: Secondary | ICD-10-CM

## 2014-10-02 DIAGNOSIS — L03113 Cellulitis of right upper limb: Secondary | ICD-10-CM

## 2014-10-02 DIAGNOSIS — Z792 Long term (current) use of antibiotics: Secondary | ICD-10-CM | POA: Insufficient documentation

## 2014-10-02 NOTE — ED Notes (Signed)
Patient came in for a recheck of Right hand. Patient is feeling pain in hand. Hand is not red and has return to patient's normal color. Patient has a scab on the knuckle of the ring finger on right hand.

## 2014-10-02 NOTE — Progress Notes (Signed)
[facility] referral placed for orange card.

## 2014-10-02 NOTE — Progress Notes (Signed)
  CARE MANAGEMENT ED NOTE 10/02/2014  Patient:  Tyler Ryan,Tyler Ryan   Account Number:  [id number]  Date Initiated:  10/02/2014  Documentation initiated by:  Radford PaxFERRERO,Sakeenah Valcarcel  Subjective/Objective Assessment:   Patient presents to Ed for right hand injury recheck     Subjective/Objective Assessment Detail:   Patient has been seen in the ED three times within the last six months.     Action/Plan:   Action/Plan Detail:   Anticipated DC Date:  10/02/2014     Status Recommendation to Physician:   Result of Recommendation:    Other ED Services  Consult Working Plan    DC Planning Services  Other  PCP issues    Choice offered to / List presented to:            Status of service:  Completed, signed off  ED Comments:   ED Comments Detail:  EDCM spoke to patient at bedside. Patient confirms he does [place] have a pcp or [place]. EDCM provide patient [facility], informed patient of services there and walk in times.  EDCM also provided patient with list of pcps who accept self pay patients, list of discount pharmacies and websites needymeds.org and GoodRX.com for medication assistance, phone number to inquire about the orange card, phone number to inquire about Mediciad, phone number to inquire about the Affordable Care Act, financial resources in the community such as local churches, [employer], urban ministries, and dental assistance for uninsured patients. EDCM also suggested patient go to Urgent care center for non emergent needs. Patient thankfulf or resources.  No further EDCM needs at this time.

## 2014-10-02 NOTE — Discharge Instructions (Signed)

## 2014-10-02 NOTE — ED Notes (Signed)
Pt here for wound re-check.  Was seen here x 2 days ago for R hand injury.  Redness is spreading but swelling has gone down.  Pt reports pain is unbearable.

## 2014-10-02 NOTE — ED Provider Notes (Signed)
CSN: [id number]     Arrival date & time 10/02/14  1850 History   First MD Initiated Contact with Patient 10/02/14 1937     Chief Complaint  Patient presents with  . Hand Problem     (Consider location/radiation/quality/duration/timing/severity/associated sxs/prior Treatment) HPI Comments: 28 yo male here for wound recheck.  He is being treated for cellulitis of right hand.  Swelling is better.  Patient is a 28 y.o. male presenting with wound check.  Wound Check This is a new problem. Episode onset: pain for about 2 months, redness and swelling for several days. The problem occurs constantly. The problem has been gradually improving. Associated symptoms comments: No fevers=. Exacerbated by: pain worse with movement, palpation. Relieved by: redness improved with Bactim and Keflex.    History reviewed. No pertinent past medical history. History reviewed. No pertinent past surgical history. No family history on file. History  Substance Use Topics  . Smoking status: Current Every Day Smoker -- 0.50 packs/day    Types: Cigarettes  . Smokeless tobacco: Not on file  . Alcohol Use: Yes    Review of Systems  All other systems reviewed and are negative.     Allergies  Review of patient's allergies indicates no known allergies.  Home Medications   Prior to Admission medications   Medication Sig Start Date End Date Taking? Authorizing Provider  cephALEXin (KEFLEX) 500 MG capsule Take 1 capsule (500 mg total) by mouth 4 (four) times daily. Patient taking differently: Take 500 mg by mouth 4 (four) times daily. For 3 days 09/30/14  Yes Shari A Upstill, PA-C  HYDROcodone-acetaminophen (NORCO/VICODIN) 5-325 MG per tablet Take 1-2 tablets by mouth every 4 (four) hours as needed. 09/30/14  Yes Shari A Upstill, PA-C  ibuprofen (ADVIL,MOTRIN) 800 MG tablet Take 800 mg by mouth every 8 (eight) hours as needed (for inflamation).   Yes Historical Provider, MD  sulfamethoxazole-trimethoprim (SEPTRA  DS) 800-160 MG per tablet Take 1 tablet by mouth every 12 (twelve) hours. Patient taking differently: Take 1 tablet by mouth every 12 (twelve) hours. For 3 days 09/30/14  Yes Shari A Upstill, PA-C  naproxen (NAPROSYN) 500 MG tablet Take 1 tablet (500 mg total) by mouth 2 (two) times daily. Patient not taking: Reported on 10/02/2014 08/26/13   Tatyana A Kirichenko, PA-C  Zinc Oxide 10 % OINT Apply 1 application topically as needed (placed on abrasions).    Historical Provider, MD   BP 132/80 mmHg  Pulse 86  Temp(Src) 97.9 F (36.6 C) (Oral)  Resp 20  SpO2 99% Physical Exam  Constitutional: He is oriented to person, place, and time. He appears well-developed and well-nourished. No distress.  HENT:  Head: Normocephalic and atraumatic.  Eyes: Conjunctivae are normal. No scleral icterus.  Neck: Neck supple.  Cardiovascular: Normal rate and intact distal pulses.   Pulmonary/Chest: Effort normal. No stridor. No respiratory distress.  Abdominal: Normal appearance. He exhibits no distension.  Musculoskeletal:  Right hand with mild old appearing abrasion to right 4th MCP knuckle.  No erythema.  No swelling.  Tenderness to palpation of right 4th metacarpal.  No fluctuance. Good range of motion of all joints of right hand.  Neurological: He is alert and oriented to person, place, and time.  Skin: Skin is warm and dry. No rash noted.  Psychiatric: He has a normal mood and affect. His behavior is normal.  Nursing note and vitals reviewed.   ED Course  Procedures (including critical care time) Labs Review Labs Reviewed - No  data to display  Imaging Review No results found.   EKG Interpretation None      MDM   Final diagnoses:  Cellulitis of right hand  Encounter for wound re-check    He appears to be improving with Keflex and Bactrim. Advised to continue course to completion. Advised follow-up with hand specialist if symptoms return.    Warnell Foresterrey Felma Pfefferle, MD 10/02/14 2034

## 2015-01-09 ENCOUNTER — Emergency Department (HOSPITAL_COMMUNITY)
Admission: EM | Admit: 2015-01-09 | Discharge: 2015-01-09 | Disposition: A | Discharge status: Home / Self Care | Payer: Self-pay | Attending: Emergency Medicine | Admitting: Emergency Medicine

## 2015-01-09 ENCOUNTER — Encounter (HOSPITAL_COMMUNITY): Payer: Self-pay | Admitting: Emergency Medicine

## 2015-01-09 ENCOUNTER — Emergency Department (HOSPITAL_COMMUNITY): Payer: Self-pay

## 2015-01-09 DIAGNOSIS — Y9289 Other specified places as the place of occurrence of the external cause: Secondary | ICD-10-CM | POA: Insufficient documentation

## 2015-01-09 DIAGNOSIS — Z791 Long term (current) use of non-steroidal anti-inflammatories (NSAID): Secondary | ICD-10-CM | POA: Insufficient documentation

## 2015-01-09 DIAGNOSIS — X58XXXA Exposure to other specified factors, initial encounter: Secondary | ICD-10-CM | POA: Insufficient documentation

## 2015-01-09 DIAGNOSIS — Z720 Tobacco use: Secondary | ICD-10-CM | POA: Insufficient documentation

## 2015-01-09 DIAGNOSIS — Y9389 Activity, other specified: Secondary | ICD-10-CM | POA: Insufficient documentation

## 2015-01-09 DIAGNOSIS — S93401A Sprain of unspecified ligament of right ankle, initial encounter: Secondary | ICD-10-CM | POA: Insufficient documentation

## 2015-01-09 DIAGNOSIS — Y998 Other external cause status: Secondary | ICD-10-CM | POA: Insufficient documentation

## 2015-01-09 MED ORDER — OXYCODONE-ACETAMINOPHEN 5-325 MG PO TABS
1.0000 | ORAL_TABLET | Freq: Once | ORAL | Status: AC
  Administered 2015-01-09: 1 via ORAL
  Filled 2015-01-09: qty 1

## 2015-01-09 NOTE — ED Notes (Signed)
Pt reports severe right ankle pain. Denies injury/trauma but works consistently on his feet. Has not had pain medicine today. Took ibuprofen last night without alleviation of pain. Decreased ROM on right foot d/t pain. 2+ pulse noted. No other c/c.

## 2015-01-09 NOTE — ED Provider Notes (Signed)
CSN: [id number]     Arrival date & time 01/09/15  1543 History   First MD Initiated Contact with Patient 01/09/15 1803     This chart was scribed for non-physician practitioner, Raymon MuttonMarissa Andreus Cure PA-C working with Richardean Canalavid H Yao, MD by Arlan OrganAshley Leger, ED Scribe. This patient was seen in room WTR5/WTR5 and the patient's care was started at 6:29 PM.   Chief Complaint  Patient presents with  . Ankle Pain    The history is provided by the patient. No language interpreter was used.    HPI Comments: Tyler CoonRobert Miceli is a 29 y.o. male without any pertinent past medical history who presents to the Emergency Department complaining of constant, ongoing R ankle pain x 1 month. Pain is described as sharp/shooting and states " my foot gave out on me today". Mr. Gery PrayBarry also reports intermittent tingling to foot but no tingling at this time. No recent injury or trauma to the ankle, however, he says he remembers stepping into a hole 1.5 months ago. He has tried OTC Ibuprofren without any improvement for symptoms. Last dose last night. No numbness to foot. No leg pain, leg swelling, hip pain, or changes to skin color. No fever, chills, SOB, or chest pain. No previous injury or surgery to foot. Pt admits to working constantly on his feet when at work as he works for a moving company 7 days a week. Pt wears sneakers and boots when working. No known allergies to medications.  No PCP at this time.   History reviewed. No pertinent past medical history. History reviewed. No pertinent past surgical history. History reviewed. No pertinent family history. History  Substance Use Topics  . Smoking status: Current Every Day Smoker -- 0.50 packs/day    Types: Cigarettes  . Smokeless tobacco: Not on file  . Alcohol Use: Yes    Review of Systems  Constitutional: Negative for fever and chills.  Respiratory: Negative for shortness of breath.   Cardiovascular: Negative for chest pain.  Musculoskeletal: Positive for arthralgias.  Negative for joint swelling.  Neurological: Negative for weakness and numbness.      Allergies  Review of patient's allergies indicates no known allergies.  Home Medications   Prior to Admission medications   Medication Sig Start Date End Date Taking? Authorizing Provider  ibuprofen (ADVIL,MOTRIN) 200 MG tablet Take 400 mg by mouth every 6 (six) hours as needed for moderate pain.   Yes Historical Provider, MD  cephALEXin (KEFLEX) 500 MG capsule Take 1 capsule (500 mg total) by mouth 4 (four) times daily. Patient not taking: Reported on 01/09/2015 09/30/14   Elpidio AnisShari Upstill, PA-C  HYDROcodone-acetaminophen (NORCO/VICODIN) 5-325 MG per tablet Take 1-2 tablets by mouth every 4 (four) hours as needed. Patient not taking: Reported on 01/09/2015 09/30/14   Elpidio AnisShari Upstill, PA-C  naproxen (NAPROSYN) 500 MG tablet Take 1 tablet (500 mg total) by mouth 2 (two) times daily. Patient not taking: Reported on 10/02/2014 08/26/13   Tatyana Kirichenko, PA-C  sulfamethoxazole-trimethoprim (SEPTRA DS) 800-160 MG per tablet Take 1 tablet by mouth every 12 (twelve) hours. Patient not taking: Reported on 01/09/2015 09/30/14   Elpidio AnisShari Upstill, PA-C   Triage Vitals: BP 150/85 mmHg  Pulse 62  Temp(Src) 98.3 F (36.8 C) (Oral)  Resp 17  SpO2 100%   Physical Exam  Constitutional: He is oriented to person, place, and time. He appears well-developed and well-nourished.  HENT:  Head: Normocephalic and atraumatic.  Eyes: Conjunctivae and EOM are normal. Right eye exhibits no discharge. Left  eye exhibits no discharge.  Neck: Normal range of motion. Neck supple.  Cardiovascular: Normal rate, regular rhythm and normal heart sounds.  Exam reveals no friction rub.   No murmur heard. Pulses:      Radial pulses are 2+ on the right side, and 2+ on the left side.       Dorsalis pedis pulses are 2+ on the right side, and 2+ on the left side.  Pulmonary/Chest: Effort normal and breath sounds normal. No respiratory distress. He  has no wheezes. He has no rales.  Musculoskeletal: He exhibits tenderness.       Right ankle: He exhibits decreased range of motion (Secondary to pain - patient is able to dorsiflex, plantar flex, invert, eversion without difficulty.). He exhibits no swelling, no ecchymosis, no deformity and no laceration. Tenderness. AITFL tenderness found. Achilles tendon exhibits normal Thompson's test results.       Feet:  Achilles tendon intact  Neurological: He is alert and oriented to person, place, and time. No cranial nerve deficit. He exhibits normal muscle tone. Coordination normal.  Cranial nerves III-XII grossly intact Strength 5+/5+ to lower extremities bilaterally with resistance applied, equal distribution noted Sensation intact with differentiation sharp and dull touch  Skin: Skin is warm and dry. No rash noted. No erythema.  Psychiatric: He has a normal mood and affect. His behavior is normal. Thought content normal.  Nursing note and vitals reviewed.   ED Course  Procedures (including critical care time)  DIAGNOSTIC STUDIES: Oxygen Saturation is 100% on RA, Normal by my interpretation.    COORDINATION OF CARE: 6:08 PM- Will order DG foot complete R and DG ankle complete R. Will give Percocet to help manage symptoms. Discussed treatment plan with pt at bedside and pt agreed to plan.     Labs Review Labs Reviewed - No data to display  Imaging Review Dg Ankle Complete Right  01/09/2015   ADDENDUM REPORT: 01/09/2015 19:13  ADDENDUM: Dictation correction:  Impression:  No fracture or dislocation.   Electronically Signed   By: Genevive Bi M.D.   On: 01/09/2015 19:13   01/09/2015   CLINICAL DATA:  Right ankle pain for 1 month  EXAM: RIGHT ANKLE - COMPLETE 3+ VIEW  COMPARISON:  Radiograph 02/18/2011  FINDINGS: Ankle mortise intact. The talar dome is normal. No malleolar fracture. The calcaneus is normal.  IMPRESSION: No acute cardiopulmonary process.  Electronically Signed: By: Genevive Bi M.D. On: 01/09/2015 17:00   Dg Foot Complete Right  01/09/2015   CLINICAL DATA:  Right ankle pain and right foot pain.  EXAM: RIGHT FOOT COMPLETE - 3+ VIEW  COMPARISON:  None.  FINDINGS: No fracture or dislocation of mid foot or forefoot. The phalanges are normal. The calcaneus is normal. No soft tissue abnormality.  IMPRESSION: No fracture or dislocation.   Electronically Signed   By: Genevive Bi M.D.   On: 01/09/2015 17:04     EKG Interpretation None      MDM   Final diagnoses:  Ankle sprain, right, initial encounter    Medications  oxyCODONE-acetaminophen (PERCOCET/ROXICET) 5-325 MG per tablet 1 tablet (1 tablet Oral Given 01/09/15 1635)    Filed Vitals:   01/09/15 1607 01/09/15 1914  BP: 150/85   Pulse: 73 62  Temp: 98.3 F (36.8 C)   TempSrc: Oral   Resp: 17   SpO2: 100% 100%   I personally performed the services described in this documentation, which was scribed in my presence. The recorded information has  been reviewed and is accurate.  Patient presenting to the ED with right ankle pain that has been ongoing for approximately one month. Patient reported that he did step in a hole while working approximately a month and half and has been having issues with his right ankle since then. 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 Reported the pain as a sharp shooting pain that stays within his ankle without radiation. Denied loss of sensation, numbness, tingling. Right ankle and right foot plain film no acute osseous abnormality identified. Pulses palpable and strong. Cap refill less than 3 seconds. Full range of motion to the right ankle identified-mildly decreased secondary to pain, but still intact. Sensation intact with differentiation sharp and dull touch. Strength intact with equal distribution. Tenderness upon palpation to the anterior talofibular ligament of the right ankle-suspicion high for ankle sprain. Negative Thompson sign.  Doubt compartment syndrome. Negative findings of ischemia. Suspicion to be ankle sprain. Patient placed in cam walker boot for comfort purposes. Patient stable, afebrile. Patient not septic appearing. Discharged patient. Discussed with patient to rest, ice, elevate. [facility] and [facility]. Discussed with patient to closely monitor symptoms and if symptoms are to worsen or change to report back to the ED - strict return instructions given.  Patient agreed to plan of care, understood, all questions answered.   Raymon Mutton, PA-C 01/09/15 1921  Richardean Canal, MD 01/09/15 [phone]

## 2015-01-09 NOTE — Discharge Instructions (Signed)
Please call your doctor for a followup appointment within 24-48 hours. When you talk to your doctor please let them know that you were seen in the emergency department and have them acquire all of your records so that they can discuss the findings with you and formulate a treatment plan to fully care for your new and ongoing problems. Please follow up with Orthopedics Please rest and stay hydrated Please elevate foot - toes above nose at all times Please ice  Please massage Please avoid any physical or strenuous activity  Please when active and moving around keep on cam walker  Please continue to monitor symptoms closely and if symptoms are to worsen or change (fever greater than 101, chills, sweating, nausea, vomiting, chest pain, shortness of breathe, difficulty breathing, weakness, numbness, tingling, worsening or changes to pain pattern, fall, injury, changes to skin color, increase pain with moving the toes, loss of sensation, back pain, inability to control urine or bowel movements) please report back to the Emergency Department immediately.    Ankle Sprain An ankle sprain is an injury to the strong, fibrous tissues (ligaments) that hold the bones of your ankle joint together.  CAUSES An ankle sprain is usually caused by a fall or by twisting your ankle. Ankle sprains most commonly occur when you step on the outer edge of your foot, and your ankle turns inward. People who participate in sports are more prone to these types of injuries.  SYMPTOMS   Pain in your ankle. The pain may be present at rest or only when you are trying to stand or walk.  Swelling.  Bruising. Bruising may develop immediately or within 1 to 2 days after your injury.  Difficulty standing or walking, particularly when turning corners or changing directions. DIAGNOSIS  Your caregiver will ask you details about your injury and perform a physical exam of your ankle to determine if you have an ankle sprain. During the  physical exam, your caregiver will press on and apply pressure to specific areas of your foot and ankle. Your caregiver will try to move your ankle in certain ways. An X-ray exam may be done to be sure a bone was not broken or a ligament did not separate from one of the bones in your ankle (avulsion fracture).  TREATMENT  Certain types of braces can help stabilize your ankle. Your caregiver can make a recommendation for this. Your caregiver may recommend the use of medicine for pain. If your sprain is severe, your caregiver may refer you to a surgeon who helps to restore function to parts of your skeletal system (orthopedist) or a physical therapist. HOME CARE INSTRUCTIONS   Apply ice to your injury for 1-2 days or as directed by your caregiver. Applying ice helps to reduce inflammation and pain.  Put ice in a plastic bag.  Place a towel between your skin and the bag.  Leave the ice on for 15-20 minutes at a time, every 2 hours while you are awake.  Only take over-the-counter or prescription medicines for pain, discomfort, or fever as directed by your caregiver.  Elevate your injured ankle above the level of your heart as much as possible for 2-3 days.  If your caregiver recommends crutches, use them as instructed. Gradually put weight on the affected ankle. Continue to use crutches or a cane until you can walk without feeling pain in your ankle.  If you have a plaster splint, wear the splint as directed by your caregiver. Do not rest  it on anything harder than a pillow for the first 24 hours. Do not put weight on it. Do not get it wet. You may take it off to take a shower or bath.  You may have been given an elastic bandage to wear around your ankle to provide support. If the elastic bandage is too tight (you have numbness or tingling in your foot or your foot becomes cold and blue), adjust the bandage to make it comfortable.  If you have an air splint, you may blow more air into it or let  air out to make it more comfortable. You may take your splint off at night and before taking a shower or bath. Wiggle your toes in the splint several times per day to decrease swelling. SEEK MEDICAL CARE IF:   You have rapidly increasing bruising or swelling.  Your toes feel extremely cold or you lose feeling in your foot.  Your pain is not relieved with medicine. SEEK IMMEDIATE MEDICAL CARE IF:  Your toes are numb or blue.  You have severe pain that is increasing. MAKE SURE YOU:   Understand these instructions.  Will watch your condition.  Will get help right away if you are not doing well or get worse. Document Released: 10/09/2005 Document Revised: 07/03/2012 Document Reviewed: 10/21/2011 [facility] Patient Information 2015 [place], [place]. This information is not intended to replace advice given to you by your health care provider. Make sure you discuss any questions you have with your health care provider.   Emergency Department Resource Guide 1) Find a Doctor and Pay Out of Pocket Although you won't have to find out who is covered by your insurance plan, it is a good idea to ask around and get recommendations. You will then need to call the office and see if the doctor you have chosen will accept you as a new patient and what types of options they offer for patients who are self-pay. Some doctors offer discounts or will set up payment plans for their patients who do not have insurance, but you will need to ask so you aren't surprised when you get to your appointment.  2) Contact Your Local Health Department Not all health departments have doctors that can see patients for sick visits, but many do, so it is worth a call to see if yours does. If you don't know where your local health department is, you can check in your phone book. The CDC also has a tool to help you locate your state's health department, and many state websites also have listings of all of their local health  departments.  3) Find a Walk-in Clinic If your illness is not likely to be very severe or complicated, you may want to try a walk in clinic. These are popping up all over the country in pharmacies, drugstores, and shopping centers. They're usually staffed by nurse practitioners or physician assistants that have been trained to treat common illnesses and complaints. They're usually fairly quick and inexpensive. However, if you have serious medical issues or chronic medical problems, these are probably not your best option.  No Primary Care Doctor: - Call Health Connect at  [phone] - they can help you locate a primary care doctor that  accepts your insurance, provides certain services, etc. - Physician Referral Service- [phone]  Chronic Pain Problems: Organization         Address  Phone   Notes  [facility]  [phone] Patients need to be referred  by their primary care doctor.   Medication Assistance: Organization         Address  Phone   Notes  [facility] [address] [place], [place] [address] [phone]) [phone] --Must be a resident of [facility] [place] -- Must have NO insurance coverage whatsoever (no Medicaid/ Medicare, etc.) -- The pt. MUST have a primary care doctor that directs their care regularly and follows them in the community   [facility]  [phone]   [employer]  [phone]    Agencies that provide inexpensive medical care: Organization         Address  Phone   Notes  [facility]  [phone]   [facility]    [phone](336) [phone]   [facility] [address] [place], [place] [address] [phone]) [phone]   [facility] [address], [place] [phone]) [phone]   [facility]anned Parenthood    [phone]) [phone]   [facility]    [phone]) [phone]   [facility]  [address], [place] Phone:  [phone](336)  [phone], Fax:  [phone] Hours of Operation:  9 am - 6 pm, M-F.  Also accepts Medicaid/Medicare and self-pay.  [facility]  [address], [place] Phone: [phone]) [phone], Fax: [phone]. Hours of Operation:  8:30 am - 5:30 pm, M-F.  Also accepts Medicaid and self-pay.  [facility] [place] [address], [place] Phone: [phone](336) [phone]   [facility] [address], [place], [place] [phone], Ext. [phone] Mondays & Thursdays: 7-9 AM.  First 15 patients are seen on a first come, first serve basis.    Medicaid-accepting [facility] Providers:  Organization         Address  Phone   Notes  [facility] [address], [place] [phone] Also accepts self-pay patients.  [facility] [address], [place]  [phone]) [phone]   [facility] [address], [place] [phone]) [phone]   [facility] [address] [place] [address], [place] [phone](336) [phone]   Renaye RakersVeita Bland [address], [place]   [phone] Only accepts [place] Access [place] patients after they have their name applied to their card.   Self-Pay (no insurance) in [facility] [place]:  Organization         Address  Phone   Notes  Sickle Cell Patients, [facility] [address], [place] [phone]) [phone]   [facility] [address], [place] [phone]) [phone]   [facility] [place]  [address], [place] [phone]   [facility]/Dr. Osei-Bonsu  [address] [place] [address], [place] or [phone], [place] [phone] Phone number for both [place] and [place] locations is the same.  [facility] [address], [place] [phone]   [facility] [place] [address] [place] [address], [place] or [address] [phone]   [facility] [address], [place] [phone]) [phone], phone; [phone], fax Sees patients 1st and 3rd Saturday of every month.  Must not qualify for public or private insurance (i.e. Medicaid, Medicare, [place] Health Choice, Veterans' Benefits)  Household income should be no more  than 200% of the poverty level The clinic cannot treat you if you are pregnant or think you are pregnant  Sexually transmitted diseases are not treated at the clinic.    Dental Care: Organization         Address  Phone  Notes  [facility] Department of [facility] [address] [place], [place] [phone] Accepts children up to age 31 who are enrolled in [place] or [place] Health Choice; pregnant women with a Medicaid card; and children who have applied for Medicaid or [place] Health Choice, but were declined, whose parents can pay a reduced fee at time of service.  [facility] of [facility]  [address], [place] [phone] Accepts children up to age 3 who are enrolled in [place] or [place] Health Choice; pregnant women with a Medicaid card; and children who have applied for Medicaid or [place] Health Choice, but were declined, whose parents can pay a reduced fee at time of service.  [facility]  [address] [place], [place] [phone] Patients are seen by appointment only. Walk-ins are not accepted. [facility] will see patients 79 years of age and older. Monday - Tuesday (8am-5pm) Most Wednesdays (8:30-5pm) $30 per visit, cash only  [facility] Adult [facility] Access PROGRAM  [address], [facility] [phone] Patients are seen by appointment only. Walk-ins are not accepted. [facility] will see patients 70 years of age and older. One Wednesday Evening (Monthly: Volunteer Based).  $30 per visit, cash only  [employer]  [phone] for adults;  Children under age 75, call Graduate Pediatric Dentistry at [phone]. Children aged 72-14, please call [phone] to request a pediatric application.  Dental services are provided in all areas of dental care including fillings, crowns and bridges, complete and partial dentures, implants, gum treatment, root canals, and extractions. Preventive care is also provided. Treatment is provided to both adults and children. Patients are selected via a lottery and there is often a waiting list.   [facility] [address] [place]  [phone] [email or web address]   [facility] [address] [place], [place] [phone], Ext. [phone] Second and Fourth Thursday of each month, opens at 6:30 AM; Clinic ends at 9 AM.  Patients are seen on a first-come first-served basis, and a limited number are seen during each clinic.   [facility]  [address] [place], [place] [phone]   Eligibility Requirements You must have lived in [place], [place], or [place] counties for at least the last three months.   You cannot be eligible for state or federal sponsored [place], including [employer], [place], or [employer].   You generally cannot be eligible for healthcare insurance through your employer.    How to apply: Eligibility screenings are held every Tuesday and Wednesday afternoon from 1:00 pm until 4:00 pm. You do not need an appointment for the interview!  [facility] [address] [place], [place] [phone]   [facility] Health Department  [phone]   [facility] Health Department  [phone]   [facility] Health Department  [phone]    Behavioral Health Resources in the Community: Intensive Outpatient Programs Organization         Address  Phone  Notes  [facility] Services [address], [place], [place] [phone]   [facility] Behavioral Health  Outpatient [address] [place], [place] [phone]   [facility] [address], [place], [place]  [phone]   [facility] [address]  [place], [place] [phone] or [phone]   Substance Abuse Resources Organization         Address  Phone  Notes  Alcohol and Drug Services  [phone]   [facility]  9898384411   [facility] [place]  [phone]   Floydene Flock  [phone]   Residential & Outpatient Substance Abuse Program  [phone]   Psychological Services Organization         Address  Phone  Notes  [facility] Health  [phone]   [facility] Services  [phone]   [facility] Mental Health [address] [place] [phone] or [phone]    Mobile Crisis Teams Organization         Address  Phone  Notes  [facility], Mobile Crisis Care Unit  [phone]   [facility]  [address] [place], [place] [phone]   Doristine Locks [address] [place] [phone]    Self-Help/Support Groups Organization         Address  Phone             Notes  [facility]ental [facility] Assoc. of [place] - variety of support groups  336- I7437963 Call for more information  Narcotics Anonymous (NA), [facility] [address], [employer] [place]  2 meetings at this location   Statistician         Address  Phone  Notes  [facility] [address],    [place]  [phone]   [facility]  [address] [place] [address], [place], [place] [phone]   [facility] [address] [place], [place] [phone] Admissions: 8am-3pm M-F  [facility] [address],    [place], [place] [phone]   [facility] [address] [place], [place], [place] [phone]   [facility] [address],  [place], [place] [phone]   [facility] - Intensive  Outpatient [address], [place], [place] [phone]   [facility] ([facility]iction Recovery [facility] [address] [place].,  [place], [place] [phone] or [phone]   [facility] (RTS) [address], [place], [place] [phone] Accepts Medicaid  [facility] [address]  [place] [phone] Substance Abuse/Addiction Treatment   [facility] Organization         Address  Phone  Notes  [facility]  2563680512   Angie Fava, PhD [address] [place], [place]   [phone] or [phone]   [facility] Behavioral   [address] [place], [place] [phone]   [facility] [address] [place], [place] [phone] Insurance/Medicaid/sponsorship through [facility] and Families [address]                                    [place], [place] [phone] Therapy/tele-psych/case  [facility] [address], [place] [phone]    Dr. Lolly Mustache  [phone]   [facility] [place]  [employer] [facility] Dept. 1) [address], [place] 2) [address] [place] 3)  [address] [place]  [address], [place] [phone]  [phone]   [facility] Child Abuse Hotline (740)792-2570 or [phone] (After Hours)

## 2015-01-31 ENCOUNTER — Emergency Department (HOSPITAL_COMMUNITY): Payer: Self-pay

## 2015-01-31 ENCOUNTER — Encounter (HOSPITAL_COMMUNITY): Payer: Self-pay

## 2015-01-31 ENCOUNTER — Emergency Department (HOSPITAL_COMMUNITY)
Admission: EM | Admit: 2015-01-31 | Discharge: 2015-01-31 | Disposition: A | Discharge status: Home / Self Care | Payer: Self-pay | Attending: Emergency Medicine | Admitting: Emergency Medicine

## 2015-01-31 DIAGNOSIS — Z8659 Personal history of other mental and behavioral disorders: Secondary | ICD-10-CM | POA: Insufficient documentation

## 2015-01-31 DIAGNOSIS — Z720 Tobacco use: Secondary | ICD-10-CM | POA: Insufficient documentation

## 2015-01-31 DIAGNOSIS — W228XXA Striking against or struck by other objects, initial encounter: Secondary | ICD-10-CM | POA: Insufficient documentation

## 2015-01-31 DIAGNOSIS — S60222A Contusion of left hand, initial encounter: Secondary | ICD-10-CM | POA: Insufficient documentation

## 2015-01-31 DIAGNOSIS — Y9289 Other specified places as the place of occurrence of the external cause: Secondary | ICD-10-CM | POA: Insufficient documentation

## 2015-01-31 DIAGNOSIS — Z79899 Other long term (current) drug therapy: Secondary | ICD-10-CM | POA: Insufficient documentation

## 2015-01-31 DIAGNOSIS — Z792 Long term (current) use of antibiotics: Secondary | ICD-10-CM | POA: Insufficient documentation

## 2015-01-31 DIAGNOSIS — T07XXXA Unspecified multiple injuries, initial encounter: Secondary | ICD-10-CM

## 2015-01-31 DIAGNOSIS — Y9389 Activity, other specified: Secondary | ICD-10-CM | POA: Insufficient documentation

## 2015-01-31 DIAGNOSIS — S60221A Contusion of right hand, initial encounter: Secondary | ICD-10-CM | POA: Insufficient documentation

## 2015-01-31 DIAGNOSIS — Y998 Other external cause status: Secondary | ICD-10-CM | POA: Insufficient documentation

## 2015-01-31 DIAGNOSIS — Z791 Long term (current) use of non-steroidal anti-inflammatories (NSAID): Secondary | ICD-10-CM | POA: Insufficient documentation

## 2015-01-31 HISTORY — DX: Intermittent explosive disorder: F63.81

## 2015-01-31 MED ORDER — IBUPROFEN 800 MG PO TABS
800.0000 mg | ORAL_TABLET | Freq: Once | ORAL | Status: AC
  Administered 2015-01-31: 800 mg via ORAL
  Filled 2015-01-31: qty 1

## 2015-01-31 MED ORDER — BACITRACIN ZINC 500 UNIT/GM EX OINT
2.0000 "application " | TOPICAL_OINTMENT | Freq: Two times a day (BID) | CUTANEOUS | Status: DC
  Filled 2015-01-31: qty 28.35

## 2015-01-31 MED ORDER — IBUPROFEN 800 MG PO TABS: 800.0000 mg | ORAL_TABLET | Freq: Three times a day (TID) | ORAL | Status: DC

## 2015-01-31 NOTE — ED Notes (Signed)
Cleanse bil hand open areas with soapy cleanser and applied bacitracin ointment and dry dsg to areas. Pt tolerated well.
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
## 2015-01-31 NOTE — ED Notes (Signed)
Awake. Verbally responsive. A/O x4. Resp even and unlabored. No audible adventitious breath sounds noted. ABC's intact.  
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
## 2015-01-31 NOTE — ED Notes (Signed)
Awake. Verbally responsive. A/O x4. Resp even and unlabored. No audible adventitious breath sounds noted. ABC's intact. Pt noted with multiple skin abrasion to to bil hands without bleeding noted. Pt waiting to go to X-ray.

## 2015-01-31 NOTE — ED Notes (Signed)
Bed: [id number] Expected date:  Expected time:  Means of arrival:  Comments: Hand injury

## 2015-01-31 NOTE — ED Notes (Signed)
Patient is alert and oriented x3.  He was given DC instructions and follow up visit instructions.  Patient gave verbal understanding.  He was DC ambulatory under his own power to home.  V/S stable.  He was not showing any signs of distress on DC 

## 2015-01-31 NOTE — Discharge Instructions (Signed)
Contusion A contusion is a deep bruise. Contusions are the result of an injury that caused bleeding under the skin. The contusion may turn blue, purple, or yellow. Minor injuries will give you a painless contusion, but more severe contusions may stay painful and swollen for a few weeks.  CAUSES  A contusion is usually caused by a blow, trauma, or direct force to an area of the body. SYMPTOMS   Swelling and redness of the injured area.  Bruising of the injured area.  Tenderness and soreness of the injured area.  Pain. DIAGNOSIS  The diagnosis can be made by taking a history and physical exam. An X-ray, CT scan, or MRI may be needed to determine if there were any associated injuries, such as fractures. TREATMENT  Specific treatment will depend on what area of the body was injured. In general, the best treatment for a contusion is resting, icing, elevating, and applying cold compresses to the injured area. Over-the-counter medicines may also be recommended for pain control. Ask your caregiver what the best treatment is for your contusion. HOME CARE INSTRUCTIONS   Put ice on the injured area.  Put ice in a plastic bag.  Place a towel between your skin and the bag.  Leave the ice on for 15-20 minutes, 3-4 times a day, or as directed by your health care provider.  Only take over-the-counter or prescription medicines for pain, discomfort, or fever as directed by your caregiver. Your caregiver may recommend avoiding anti-inflammatory medicines (aspirin, ibuprofen, and naproxen) for 48 hours because these medicines may increase bruising.  Rest the injured area.  If possible, elevate the injured area to reduce swelling. SEEK IMMEDIATE MEDICAL CARE IF:   You have increased bruising or swelling.  You have pain that is getting worse.  Your swelling or pain is not relieved with medicines. MAKE SURE YOU:   Understand these instructions.  Will watch your condition.  Will get help right  away if you are not doing well or get worse. Document Released: 07/19/2005 Document Revised: 10/14/2013 Document Reviewed: 08/14/2011 [facility] Patient Information 2015 [place], [place]. This information is not intended to replace advice given to you by your health care provider. Make sure you discuss any questions you have with your health care provider.  Abrasion An abrasion is a cut or scrape of the skin. Abrasions do not extend through all layers of the skin and most heal within 10 days. It is important to care for your abrasion properly to prevent infection. CAUSES  Most abrasions are caused by falling on, or gliding across, the ground or other surface. When your skin rubs on something, the outer and inner layer of skin rubs off, causing an abrasion. DIAGNOSIS  Your caregiver will be able to diagnose an abrasion during a physical exam.  TREATMENT  Your treatment depends on how large and deep the abrasion is. Generally, your abrasion will be cleaned with water and a mild soap to remove any dirt or debris. An antibiotic ointment may be put over the abrasion to prevent an infection. A bandage (dressing) may be wrapped around the abrasion to keep it from getting dirty.  You may need a tetanus shot if:  You cannot remember when you had your last tetanus shot.  You have never had a tetanus shot.  The injury broke your skin. If you get a tetanus shot, your arm may swell, get red, and feel warm to the touch. This is common and not a problem. If you need a  tetanus shot and you choose not to have one, there is a rare chance of getting tetanus. Sickness from tetanus can be serious.  HOME CARE INSTRUCTIONS   If a dressing was applied, change it at least once a day or as directed by your caregiver. If the bandage sticks, soak it off with warm water.   Wash the area with water and a mild soap to remove all the ointment 2 times a day. Rinse off the soap and pat the area dry with a clean towel.    Reapply any ointment as directed by your caregiver. This will help prevent infection and keep the bandage from sticking. Use gauze over the wound and under the dressing to help keep the bandage from sticking.   Change your dressing right away if it becomes wet or dirty.   Only take over-the-counter or prescription medicines for pain, discomfort, or fever as directed by your caregiver.   Follow up with your caregiver within 24-48 hours for a wound check, or as directed. If you were not given a wound-check appointment, look closely at your abrasion for redness, swelling, or pus. These are signs of infection. SEEK IMMEDIATE MEDICAL CARE IF:   You have increasing pain in the wound.   You have redness, swelling, or tenderness around the wound.   You have pus coming from the wound.   You have a fever or persistent symptoms for more than 2-3 days.  You have a fever and your symptoms suddenly get worse.  You have a bad smell coming from the wound or dressing.  MAKE SURE YOU:   Understand these instructions.  Will watch your condition.  Will get help right away if you are not doing well or get worse. Document Released: 07/19/2005 Document Revised: 09/25/2012 Document Reviewed: 09/12/2011 [facility] Patient Information 2015 [place], [place]. This information is not intended to replace advice given to you by your health care provider. Make sure you discuss any questions you have with your health care provider.  Abrasion An abrasion is a cut or scrape of the skin. Abrasions do not extend through all layers of the skin and most heal within 10 days. It is important to care for your abrasion properly to prevent infection. CAUSES  Most abrasions are caused by falling on, or gliding across, the ground or other surface. When your skin rubs on something, the outer and inner layer of skin rubs off, causing an abrasion. DIAGNOSIS  Your caregiver will be able to diagnose an abrasion during a  physical exam.  TREATMENT  Your treatment depends on how large and deep the abrasion is. Generally, your abrasion will be cleaned with water and a mild soap to remove any dirt or debris. An antibiotic ointment may be put over the abrasion to prevent an infection. A bandage (dressing) may be wrapped around the abrasion to keep it from getting dirty.  You may need a tetanus shot if:  You cannot remember when you had your last tetanus shot.  You have never had a tetanus shot.  The injury broke your skin. If you get a tetanus shot, your arm may swell, get red, and feel warm to the touch. This is common and not a problem. If you need a tetanus shot and you choose not to have one, there is a rare chance of getting tetanus. Sickness from tetanus can be serious.  HOME CARE INSTRUCTIONS   If a dressing was applied, change it at least once a day or as directed  by your caregiver. If the bandage sticks, soak it off with warm water.   Wash the area with water and a mild soap to remove all the ointment 2 times a day. Rinse off the soap and pat the area dry with a clean towel.   Reapply any ointment as directed by your caregiver. This will help prevent infection and keep the bandage from sticking. Use gauze over the wound and under the dressing to help keep the bandage from sticking.   Change your dressing right away if it becomes wet or dirty.   Only take over-the-counter or prescription medicines for pain, discomfort, or fever as directed by your caregiver.   Follow up with your caregiver within 24-48 hours for a wound check, or as directed. If you were not given a wound-check appointment, look closely at your abrasion for redness, swelling, or pus. These are signs of infection. SEEK IMMEDIATE MEDICAL CARE IF:   You have increasing pain in the wound.   You have redness, swelling, or tenderness around the wound.   You have pus coming from the wound.   You have a fever or persistent symptoms  for more than 2-3 days.  You have a fever and your symptoms suddenly get worse.  You have a bad smell coming from the wound or dressing.  MAKE SURE YOU:   Understand these instructions.  Will watch your condition.  Will get help right away if you are not doing well or get worse. Document Released: 07/19/2005 Document Revised: 09/25/2012 Document Reviewed: 09/12/2011 [facility] Patient Information 2015 [place]. This information is not intended to replace advice given to you by your health care provider. Make sure you discuss any questions you have with your health care provider.

## 2015-05-21 ENCOUNTER — Emergency Department (HOSPITAL_COMMUNITY): Payer: No Typology Code available for payment source

## 2015-05-21 ENCOUNTER — Encounter (HOSPITAL_COMMUNITY): Payer: Self-pay | Admitting: Emergency Medicine

## 2015-05-21 ENCOUNTER — Emergency Department (HOSPITAL_COMMUNITY)
Admission: EM | Admit: 2015-05-21 | Discharge: 2015-05-21 | Disposition: A | Discharge status: Home / Self Care | Payer: Self-pay | Attending: Emergency Medicine | Admitting: Emergency Medicine

## 2015-05-21 DIAGNOSIS — Z8659 Personal history of other mental and behavioral disorders: Secondary | ICD-10-CM | POA: Insufficient documentation

## 2015-05-21 DIAGNOSIS — Z720 Tobacco use: Secondary | ICD-10-CM | POA: Insufficient documentation

## 2015-05-21 DIAGNOSIS — Z791 Long term (current) use of non-steroidal anti-inflammatories (NSAID): Secondary | ICD-10-CM | POA: Insufficient documentation

## 2015-05-21 DIAGNOSIS — M7551 Bursitis of right shoulder: Secondary | ICD-10-CM | POA: Insufficient documentation

## 2015-05-21 MED ORDER — IBUPROFEN 800 MG PO TABS: 800.0000 mg | ORAL_TABLET | Freq: Three times a day (TID) | ORAL | Status: DC

## 2015-05-21 MED ORDER — METHOCARBAMOL 500 MG PO TABS: 1000.0000 mg | ORAL_TABLET | Freq: Two times a day (BID) | ORAL | Status: DC

## 2015-05-21 NOTE — Discharge Instructions (Signed)
Bursitis °Bursitis is a swelling and soreness (inflammation) of a fluid-filled sac (bursa) that overlies and protects a joint. It can be caused by injury, overuse of the joint, arthritis or infection. The joints most likely to be affected are the elbows, shoulders, hips and knees. °HOME CARE INSTRUCTIONS  °· Apply ice to the affected area for 15-20 minutes each hour while awake for 2 days. Put the ice in a plastic bag and place a towel between the bag of ice and your skin. °· Rest the injured joint as much as possible, but continue to put the joint through a full range of motion, 4 times per day. (The shoulder joint especially becomes rapidly "frozen" if not used.) When the pain lessens, begin normal slow movements and usual activities. °· Only take over-the-counter or prescription medicines for pain, discomfort or fever as directed by your caregiver. °· Your caregiver may recommend draining the bursa and injecting medicine into the bursa. This may help the healing process. °· Follow all instructions for follow-up with your caregiver. This includes any orthopedic referrals, physical therapy and rehabilitation. Any delay in obtaining necessary care could result in a delay or failure of the bursitis to heal and chronic pain. °SEEK IMMEDIATE MEDICAL CARE IF:  °· Your pain increases even during treatment. °· You develop an oral temperature above 102° F (38.9° C) and have heat and inflammation over the involved bursa. °MAKE SURE YOU:  °· Understand these instructions. °· Will watch your condition. °· Will get help right away if you are not doing well or get worse. °Document Released: 10/06/2000 Document Revised: 01/01/2012 Document Reviewed: 12/29/2013 °[employer]® Patient Information ©2015 [employer]. This information is not intended to replace advice given to you by your health care provider. Make sure you discuss any questions you have with your health care provider. °Cryotherapy °Cryotherapy means treatment with  cold. Ice or gel packs can be used to reduce both pain and swelling. Ice is the most helpful within the first 24 to 48 hours after an injury or flare-up from overusing a muscle or joint. Sprains, strains, spasms, burning pain, shooting pain, and aches can all be eased with ice. Ice can also be used when recovering from surgery. Ice is effective, has very few side effects, and is safe for most people to use. °PRECAUTIONS  °Ice is not a safe treatment option for people with: °· Raynaud phenomenon. This is a condition affecting small blood vessels in the extremities. Exposure to cold may cause your problems to return. °· Cold hypersensitivity. There are many forms of cold hypersensitivity, including: °¨ Cold urticaria. Red, itchy hives appear on the skin when the tissues begin to warm after being iced. °¨ Cold erythema. This is a red, itchy rash caused by exposure to cold. °¨ Cold hemoglobinuria. Red blood cells break down when the tissues begin to warm after being iced. The hemoglobin that carry oxygen are passed into the urine because they cannot combine with blood proteins fast enough. °· Numbness or altered sensitivity in the area being iced. °If you have any of the following conditions, do not use ice until you have discussed cryotherapy with your caregiver: °· Heart conditions, such as arrhythmia, angina, or chronic heart disease. °· High blood pressure. °· Healing wounds or open skin in the area being iced. °· Current infections. °· Rheumatoid arthritis. °· Poor circulation. °· Diabetes. °Ice slows the blood flow in the region it is applied. This is beneficial when trying to stop inflamed tissues from spreading   irritating chemicals to surrounding tissues. However, if you expose your skin to cold temperatures for too long or without the proper protection, you can damage your skin or nerves. Watch for signs of skin damage due to cold. °HOME CARE INSTRUCTIONS °Follow these tips to use ice and cold packs  safely. °· Place a dry or damp towel between the ice and skin. A damp towel will cool the skin more quickly, so you may need to shorten the time that the ice is used. °· For a more rapid response, add gentle compression to the ice. °· Ice for no more than 10 to 20 minutes at a time. The bonier the area you are icing, the less time it will take to get the benefits of ice. °· Check your skin after 5 minutes to make sure there are no signs of a poor response to cold or skin damage. °· Rest 20 minutes or more between uses. °· Once your skin is numb, you can end your treatment. You can test numbness by very lightly touching your skin. The touch should be so light that you do not see the skin dimple from the pressure of your fingertip. When using ice, most people will feel these normal sensations in this order: cold, burning, aching, and numbness. °· Do not use ice on someone who cannot communicate their responses to pain, such as small children or people with dementia. °HOW TO MAKE AN ICE PACK °Ice packs are the most common way to use ice therapy. Other methods include ice massage, ice baths, and cryosprays. Muscle creams that cause a cold, tingly feeling do not offer the same benefits that ice offers and should not be used as a substitute unless recommended by your caregiver. °To make an ice pack, do one of the following: °· Place crushed ice or a bag of frozen vegetables in a sealable plastic bag. Squeeze out the excess air. Place this bag inside another plastic bag. Slide the bag into a pillowcase or place a damp towel between your skin and the bag. °· Mix 3 parts water with 1 part rubbing alcohol. Freeze the mixture in a sealable plastic bag. When you remove the mixture from the freezer, it will be slushy. Squeeze out the excess air. Place this bag inside another plastic bag. Slide the bag into a pillowcase or place a damp towel between your skin and the bag. °SEEK MEDICAL CARE IF: °· You develop white spots on your  skin. This may give the skin a blotchy (mottled) appearance. °· Your skin turns blue or pale. °· Your skin becomes waxy or hard. °· Your swelling gets worse. °MAKE SURE YOU:  °· Understand these instructions. °· Will watch your condition. °· Will get help right away if you are not doing well or get worse. °Document Released: 06/05/2011 Document Revised: 02/23/2014 Document Reviewed: 06/05/2011 °[employer]® Patient Information ©2015 [employer]. This information is not intended to replace advice given to you by your health care provider. Make sure you discuss any questions you have with your health care provider. ° °

## 2015-05-21 NOTE — ED Notes (Signed)
Pt was at work (lifts objects frequently at work) and started having severe right shoulder pain. Unsure what he did to injure shoulder. Attempted icing shoulder. Took Ibuprofen around 1700 with no alleviation of pain. No obvious deformities.

## 2015-05-21 NOTE — ED Provider Notes (Signed)
CSN: [id number]     Arrival date & time 05/21/15  2244 History  This chart was scribed for non-physician practitioner Elpidio Anis, PA-C working with Zadie Rhine, MD by Lyndel Safe, ED Scribe. This patient was seen in room WTR9/WTR9 and the patient's care was started at 11:13 PM.   Chief Complaint  Patient presents with  . Shoulder Pain   The history is provided by the patient. No language interpreter was used.    HPI Comments: Tyler Ryan is a 29 y.o. male who presents to the Emergency Department complaining of sudden onset, constant, moderate right shoulder pain onset yesterday. His shoulder pain is exacerbated on abduction. Pt states he was at work where he lifts objects frequently and started to experience his right shoulder pain yesterday. He is unsure of an attributable injury. Pt took  ibuprofen 6 hours ago with no relief. Denies an attributable fall or injury, past injuries to right shoulder, neck or back pain. NKDA  Past Medical History  Diagnosis Date  . Intermittent explosive disorder    History reviewed. No pertinent past surgical history. Family History  Problem Relation Age of Onset  . Adopted: Yes   History  Substance Use Topics  . Smoking status: Current Every Day Smoker -- 0.50 packs/day    Types: Cigarettes  . Smokeless tobacco: Current User    Types: Snuff  . Alcohol Use: Yes     Comment: occasionally    Review of Systems  Constitutional: Negative for fever and diaphoresis.  Cardiovascular: Negative for chest pain.  Gastrointestinal: Negative for nausea.  Musculoskeletal: Positive for arthralgias. Negative for back pain and neck pain.   Allergies  Review of patient's allergies indicates no known allergies.  Home Medications   Prior to Admission medications   Medication Sig Start Date End Date Taking? Authorizing Provider  cephALEXin (KEFLEX) 500 MG capsule Take 1 capsule (500 mg total) by mouth 4 (four) times daily. Patient not taking:  Reported on 01/09/2015 09/30/14   Elpidio Anis, PA-C  HYDROcodone-acetaminophen (NORCO/VICODIN) 5-325 MG per tablet Take 1-2 tablets by mouth every 4 (four) hours as needed. Patient not taking: Reported on 01/09/2015 09/30/14   Elpidio Anis, PA-C  ibuprofen (ADVIL,MOTRIN) 800 MG tablet Take 1 tablet (800 mg total) by mouth 3 (three) times daily. 01/31/15   Arby Barrette, MD  naproxen (NAPROSYN) 500 MG tablet Take 1 tablet (500 mg total) by mouth 2 (two) times daily. Patient not taking: Reported on 10/02/2014 08/26/13   Tatyana Kirichenko, PA-C  sulfamethoxazole-trimethoprim (SEPTRA DS) 800-160 MG per tablet Take 1 tablet by mouth every 12 (twelve) hours. Patient not taking: Reported on 01/09/2015 09/30/14   Elpidio Anis, PA-C   BP 126/87 mmHg  Pulse 85  Temp(Src) 98.5 F (36.9 C) (Oral)  Resp 20  Ht  (1.803 m)  Wt 175 lb (79.379 kg)  BMI 24.42 kg/m2  SpO2 96% Physical Exam  Constitutional: He appears well-developed and well-nourished.  HENT:  Head: Normocephalic and atraumatic.  Eyes: Conjunctivae are normal. Right eye exhibits no discharge. Left eye exhibits no discharge.  Neck: Normal range of motion.  Pulmonary/Chest: Effort normal. No respiratory distress.  Musculoskeletal: Normal range of motion. He exhibits no tenderness.  No cervical midline tenderness. Right shoulder; no bony abnormality of the right shoulder; no scapula tenderness; passive rotation of the shoulder is non tender.  Neurological: He is alert. Coordination normal.  Skin: Skin is warm and dry. No rash noted. He is not diaphoretic. No erythema.  Psychiatric: He has  a normal mood and affect.  Nursing note and vitals reviewed.   ED Course  Procedures  DIAGNOSTIC STUDIES: Oxygen Saturation is 96% on RA, normal by my interpretation.    COORDINATION OF CARE: 11:15 PM Discussed treatment plan with pt. Advised pt to continue use of NSAIDs, apply ice to right shoulder 3-4 times per day for 20 minute intervals.  Will give referral to orthopedist if pain continues or worsens for a week. Pt acknowledges and agrees to plan.   Labs Review Labs Reviewed - No data to display  Imaging Review No results found.   EKG Interpretation None      MDM   Final diagnoses:  None    1. Right shoulder strain  Negative exam for objective findings, normal ROM, no bony abnormalities, swelling or redness. Will treat as minor strain.  I personally performed the services described in this documentation, which was scribed in my presence. The recorded information has been reviewed and is accurate.     Elpidio Anis, PA-C 05/23/15 1610  Zadie Rhine, MD 05/24/15 1134

## 2016-05-16 ENCOUNTER — Encounter (HOSPITAL_COMMUNITY): Payer: Self-pay

## 2016-05-16 ENCOUNTER — Emergency Department (HOSPITAL_COMMUNITY): Payer: Self-pay

## 2016-05-16 ENCOUNTER — Emergency Department (HOSPITAL_COMMUNITY)
Admission: EM | Admit: 2016-05-16 | Discharge: 2016-05-16 | Disposition: A | Discharge status: Home / Self Care | Payer: Self-pay | Attending: Emergency Medicine | Admitting: Emergency Medicine

## 2016-05-16 DIAGNOSIS — Y929 Unspecified place or not applicable: Secondary | ICD-10-CM | POA: Insufficient documentation

## 2016-05-16 DIAGNOSIS — Y939 Activity, unspecified: Secondary | ICD-10-CM | POA: Insufficient documentation

## 2016-05-16 DIAGNOSIS — W1839XA Other fall on same level, initial encounter: Secondary | ICD-10-CM | POA: Insufficient documentation

## 2016-05-16 DIAGNOSIS — F17210 Nicotine dependence, cigarettes, uncomplicated: Secondary | ICD-10-CM | POA: Insufficient documentation

## 2016-05-16 DIAGNOSIS — Y999 Unspecified external cause status: Secondary | ICD-10-CM | POA: Insufficient documentation

## 2016-05-16 DIAGNOSIS — M79641 Pain in right hand: Secondary | ICD-10-CM | POA: Insufficient documentation

## 2016-05-16 MED ORDER — HYDROCODONE-ACETAMINOPHEN 5-325 MG PO TABS: 1.0000 | ORAL_TABLET | ORAL | 0 refills | Status: DC | PRN

## 2016-05-16 MED ORDER — IBUPROFEN 600 MG PO TABS
600.0000 mg | ORAL_TABLET | Freq: Four times a day (QID) | ORAL | 0 refills | Status: DC | PRN
Start: 2016-05-16 — End: 2016-07-28

## 2016-05-16 NOTE — Progress Notes (Signed)
Pt has pain to his right hand with minimal range of motion,. Pt remains with an ice pack on his hand and it is elevated. Positive radial pulse and positive capillary refill.

## 2016-05-16 NOTE — ED Triage Notes (Signed)
PT HERE C/O RIGHT HAND PAIN AFTER HE GOT HIS HAND CAUGHT IN BETWEEN THE ENGINE, AND THEN HE FELL ON THE SAME HAND TODAY.

## 2016-05-16 NOTE — ED Notes (Signed)
Report to oncoming shift.

## 2016-05-16 NOTE — ED Provider Notes (Signed)
[facility]-EMERGENCY DEPT Provider Note   CSN: [id number] Arrival date & time: 05/16/16  2141  First Provider Contact:  None       History   Chief Complaint Chief Complaint  Patient presents with  . Hand Pain    X3 DAYS    HPI Tyler Ryan is a 30 y.o. male.  Patient is a 30 year old male who presents the ED with complaint of right hand pain. 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 Patient states his hand was squeezed in between the engine and carburetor and reports having mild pain to his right. He states today he fell onto his right hand after tripping resulting in worsening pain. Patient states pain is worse with movement. Endorses taking ibuprofen at home without relief. Denies numbness, tingling, weakness.      Past Medical History:  Diagnosis Date  . Intermittent explosive disorder     There are no active problems to display for this patient.   History reviewed. No pertinent surgical history.     Home Medications    Prior to Admission medications   Medication Sig Start Date End Date Taking? Authorizing Provider  cephALEXin (KEFLEX) 500 MG capsule Take 1 capsule (500 mg total) by mouth 4 (four) times daily. Patient not taking: Reported on 01/09/2015 09/30/14   Elpidio Anis, PA-C  HYDROcodone-acetaminophen (NORCO/VICODIN) 5-325 MG per tablet Take 1-2 tablets by mouth every 4 (four) hours as needed. Patient not taking: Reported on 01/09/2015 09/30/14   Elpidio Anis, PA-C  HYDROcodone-acetaminophen (NORCO/VICODIN) 5-325 MG tablet Take 1 tablet by mouth every 4 (four) hours as needed. 05/16/16   Barrett Henle, PA-C  ibuprofen (ADVIL,MOTRIN) 600 MG tablet Take 1 tablet (600 mg total) by mouth every 6 (six) hours as needed. 05/16/16   Barrett Henle, PA-C  ibuprofen (ADVIL,MOTRIN) 800 MG tablet Take 1 tablet (800 mg total) by mouth 3 (three) times daily. 05/21/15   Elpidio Anis, PA-C  methocarbamol  (ROBAXIN) 500 MG tablet Take 2 tablets (1,000 mg total) by mouth 2 (two) times daily. 05/21/15   Elpidio Anis, PA-C  naproxen (NAPROSYN) 500 MG tablet Take 1 tablet (500 mg total) by mouth 2 (two) times daily. Patient not taking: Reported on 10/02/2014 08/26/13   Tatyana Kirichenko, PA-C  sulfamethoxazole-trimethoprim (SEPTRA DS) 800-160 MG per tablet Take 1 tablet by mouth every 12 (twelve) hours. Patient not taking: Reported on 01/09/2015 09/30/14   Elpidio Anis, PA-C    Family History Family History  Problem Relation Age of Onset  . Adopted: Yes    Social History Social History  Substance Use Topics  . Smoking status: Current Every Day Smoker    Packs/day: 0.50    Types: Cigarettes  . Smokeless tobacco: Current User    Types: Snuff  . Alcohol use Yes     Comment: occasionally     Allergies   Review of patient's allergies indicates no known allergies.   Review of Systems Review of Systems  Constitutional: Negative for fever.  Musculoskeletal: Positive for arthralgias (right hand).  Neurological: Negative for weakness and numbness.     Physical Exam Updated Vital Signs BP 138/99 (BP Location: Right Arm)   Pulse 76   Temp 99.1 F (37.3 C) (Oral)   Resp 20   Ht 5\' 11"  (1.803 m)   Wt 63.5 kg   BMI 19.53 kg/m   Physical Exam  Constitutional: He is oriented to person, place, and time. He  appears well-developed and well-nourished.  HENT:  Head: Normocephalic and atraumatic.  Eyes: Conjunctivae and EOM are normal. Right eye exhibits no discharge. Left eye exhibits no discharge. No scleral icterus.  Neck: Normal range of motion. Neck supple.  Cardiovascular: Normal rate and intact distal pulses.   Pulmonary/Chest: Effort normal.  Musculoskeletal: Normal range of motion. He exhibits tenderness. He exhibits no deformity.       Right hand: He exhibits tenderness. He exhibits normal range of motion, normal two-point discrimination, normal capillary refill, no deformity,  no laceration and no swelling. Normal sensation noted. Normal strength noted.       Hands: Tenderness over right fourth and fifth MCP joints and right fifth metacarpal. Full range of motion of right hand and wrist forearm and elbow with 5 out of 5 strength. Equal grip strength bilaterally. 2+ radial pulses. Sensation grossly intact. Cap refill less than 2.  Neurological: He is alert and oriented to person, place, and time.  Nursing note and vitals reviewed.    ED Treatments / Results  Labs (all labs ordered are listed, but only abnormal results are displayed) Labs Reviewed - No data to display  EKG  EKG Interpretation None       Radiology Dg Wrist Complete Right  Result Date: 05/16/2016 CLINICAL DATA:  30 year old male with fall and right hand and right wrist pain. EXAM: RIGHT HAND - COMPLETE 3+ VIEW; RIGHT WRIST - COMPLETE 3+ VIEW COMPARISON:  Radiograph dated 01/31/2015 FINDINGS: There is no evidence of fracture or dislocation. There is no evidence of arthropathy or other focal bone abnormality. Soft tissues are unremarkable. IMPRESSION: Negative. Electronically Signed   By: Elgie Collard M.D.   On: 05/16/2016 22:26  Dg Hand Complete Right  Result Date: 05/16/2016 CLINICAL DATA:  30 year old male with fall and right hand and right wrist pain. EXAM: RIGHT HAND - COMPLETE 3+ VIEW; RIGHT WRIST - COMPLETE 3+ VIEW COMPARISON:  Radiograph dated 01/31/2015 FINDINGS: There is no evidence of fracture or dislocation. There is no evidence of arthropathy or other focal bone abnormality. Soft tissues are unremarkable. IMPRESSION: Negative. Electronically Signed   By: Elgie Collard M.D.   On: 05/16/2016 22:26   Procedures Procedures (including critical care time)  Medications Ordered in ED Medications - No data to display   Initial Impression / Assessment and Plan / ED Course  I have reviewed the triage vital signs and the nursing notes.  Pertinent labs & imaging results that were  available during my care of the patient were reviewed by me and considered in my medical decision making (see chart for details).  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 VSS. Exam revealed tenderness over right fourth and fifth MCP joints and right fifth metacarpal, right hand and upper extremity neurovascularly intact. Right hand and wrist x-ray negative. Discussed results and plan for discharge with patient. Plan to discharge patient home with pain meds, NSAIDs and symptomatic treatment. Advised patient to follow up with orthopedics as needed. Discussed return precautions with patient.  Final Clinical Impressions(s) / ED Diagnoses   Final diagnoses:  Right hand pain    New Prescriptions New Prescriptions   HYDROCODONE-ACETAMINOPHEN (NORCO/VICODIN) 5-325 MG TABLET    Take 1 tablet by mouth every 4 (four) hours as needed.   IBUPROFEN (ADVIL,MOTRIN) 600 MG TABLET    Take 1 tablet (600 mg total) by  mouth every 6 (six) hours as needed.     Satira Sark [place], [place] 05/17/16 0150    Dione Booze, MD 05/17/16 [phone]

## 2016-05-16 NOTE — Discharge Instructions (Signed)
Take your medications as prescribed. Continue to place ice on your hand for 15-20 minutes 3-4 times daily to help with pain and swelling. Follow up with the Orthopedic clinic listed above if your symptoms do not improved over the next week. Please return to the Emergency Department if symptoms worsen or new onset of fever, redness, swelling, warmth, numbness, tingling, weakness.

## 2016-05-16 NOTE — ED Notes (Signed)
PA at bedside.

## 2016-05-16 NOTE — ED Notes (Signed)
Pt ambulatory and independent at discharge.  Verbalized understanding of discharge instructions 

## 2016-07-28 ENCOUNTER — Emergency Department (HOSPITAL_COMMUNITY)
Admission: EM | Admit: 2016-07-28 | Discharge: 2016-07-28 | Disposition: A | Discharge status: Home / Self Care | Payer: Self-pay | Attending: Physician Assistant | Admitting: Physician Assistant

## 2016-07-28 ENCOUNTER — Emergency Department (HOSPITAL_COMMUNITY): Payer: Self-pay

## 2016-07-28 ENCOUNTER — Encounter (HOSPITAL_COMMUNITY): Payer: Self-pay | Admitting: *Deleted

## 2016-07-28 DIAGNOSIS — K047 Periapical abscess without sinus: Secondary | ICD-10-CM | POA: Insufficient documentation

## 2016-07-28 DIAGNOSIS — F17210 Nicotine dependence, cigarettes, uncomplicated: Secondary | ICD-10-CM | POA: Insufficient documentation

## 2016-07-28 LAB — CBC WITH DIFFERENTIAL/PLATELET
Basophils Absolute: 0 10*3/uL (ref 0.0–0.1)
Basophils Relative: 0 %
Eosinophils Absolute: 0.2 10*3/uL (ref 0.0–0.7)
Eosinophils Relative: 1 %
HCT: 44 % (ref 39.0–52.0)
Hemoglobin: 14.8 g/dL (ref 13.0–17.0)
Lymphocytes Relative: 15 %
Lymphs Abs: 2.6 10*3/uL (ref 0.7–4.0)
MCH: 27.1 pg (ref 26.0–34.0)
MCHC: 33.6 g/dL (ref 30.0–36.0)
MCV: 80.6 fL (ref 78.0–100.0)
Monocytes Absolute: 2.1 10*3/uL — ABNORMAL HIGH (ref 0.1–1.0)
Monocytes Relative: 12 %
Neutro Abs: 12.5 10*3/uL — ABNORMAL HIGH (ref 1.7–7.7)
Neutrophils Relative %: 72 %
Platelets: 407 10*3/uL — ABNORMAL HIGH (ref 150–400)
RBC: 5.46 MIL/uL (ref 4.22–5.81)
RDW: 14.8 % (ref 11.5–15.5)
WBC: 17.4 10*3/uL — ABNORMAL HIGH (ref 4.0–10.5)

## 2016-07-28 LAB — BASIC METABOLIC PANEL
Anion gap: 9 (ref 5–15)
BUN: 7 mg/dL (ref 6–20)
CO2: 26 mmol/L (ref 22–32)
Calcium: 9.2 mg/dL (ref 8.9–10.3)
Chloride: 103 mmol/L (ref 101–111)
Creatinine, Ser: 0.86 mg/dL (ref 0.61–1.24)
GFR calc Af Amer: >60 mL/min (ref >60)
GFR calc non Af Amer: >60 mL/min (ref >60)
Glucose, Bld: 88 mg/dL (ref 65–99)
Potassium: 3.6 mmol/L (ref 3.5–5.1)
Sodium: 138 mmol/L (ref 135–145)

## 2016-07-28 MED ORDER — MORPHINE SULFATE (PF) 4 MG/ML IV SOLN
4.0000 mg | Freq: Once | INTRAVENOUS | Status: AC
  Administered 2016-07-28: 4 mg via INTRAVENOUS
  Filled 2016-07-28: qty 1

## 2016-07-28 MED ORDER — IBUPROFEN 800 MG PO TABS: 800.0000 mg | ORAL_TABLET | Freq: Three times a day (TID) | ORAL | 0 refills | Status: DC | PRN

## 2016-07-28 MED ORDER — ONDANSETRON HCL 4 MG/2ML IJ SOLN
4.0000 mg | Freq: Once | INTRAMUSCULAR | Status: AC
  Administered 2016-07-28: 4 mg via INTRAVENOUS
  Filled 2016-07-28: qty 2

## 2016-07-28 MED ORDER — CLINDAMYCIN HCL 150 MG PO CAPS: 450.0000 mg | ORAL_CAPSULE | Freq: Three times a day (TID) | ORAL | 0 refills | Status: DC

## 2016-07-28 MED ORDER — CLINDAMYCIN PHOSPHATE 900 MG/50ML IV SOLN
900.0000 mg | Freq: Once | INTRAVENOUS | Status: AC
  Administered 2016-07-28: 900 mg via INTRAVENOUS
  Filled 2016-07-28: qty 50

## 2016-07-28 MED ORDER — IOPAMIDOL (ISOVUE-300) INJECTION 61%
INTRAVENOUS | Status: AC
  Administered 2016-07-28: 75 mL
  Filled 2016-07-28: qty 75

## 2016-07-28 MED ORDER — OXYCODONE-ACETAMINOPHEN 5-325 MG PO TABS: 1.0000 | ORAL_TABLET | Freq: Four times a day (QID) | ORAL | 0 refills | Status: DC | PRN

## 2016-07-28 NOTE — Discharge Instructions (Signed)
Return here as needed.  Follow-up with the dentist provided the CT scan did not show any airway issues.  There is a forming abscess

## 2016-07-28 NOTE — ED Triage Notes (Signed)
Pt reports dental pain right side, moderate swelling noted. Pt reports swelling has moved down into his throat and had increase in difficulty swallowing. Airway is intact at triage, no resp distress noted.

## 2016-07-28 NOTE — ED Notes (Signed)
Pt back from CT.  Pt hooked up and has call light with-in reach.

## 2016-07-28 NOTE — ED Notes (Signed)
Pt to CT

## 2016-07-28 NOTE — ED Notes (Signed)
Patient transported to CT via stretcher.

## 2016-07-30 NOTE — ED Provider Notes (Signed)
[facility]   CSN: [id number] Arrival date & time: 07/28/16  7829     History   Chief Complaint Chief Complaint  Patient presents with  . Abscess  . Dental Pain    HPI Tyler Ryan is a 30 y.o. male.  HPI Patient presents to the emergency department with Dental pain and swelling.  The patient states that he has several bad teeth along the upper and lower dentition.  He states that he noticed swelling of the upper gum line.  Patient states that he is also noticed swelling in his neck.  Patient states that nothing seems make the condition better, but palpation makes pain worse.  Patient states he did not take any medications prior to arrival.  The patient denies chest pain, shortness of breath, headache,blurred vision, neck pain, fever, cough, weakness, numbness, dizziness, anorexia, edema, abdominal pain, nausea, vomiting, diarrhea, rash, back pain, dysuria, hematemesis, bloody stool, near syncope, or syncope. Past Medical History:  Diagnosis Date  . Intermittent explosive disorder     There are no active problems to display for this patient.   History reviewed. No pertinent surgical history.     Home Medications    Prior to Admission medications   Medication Sig Start Date End Date Taking? Authorizing Provider  cephALEXin (KEFLEX) 500 MG capsule Take 1 capsule (500 mg total) by mouth 4 (four) times daily. Patient not taking: Reported on 07/28/2016 09/30/14   Elpidio Anis, PA-C  clindamycin (CLEOCIN) 150 MG capsule Take 3 capsules (450 mg total) by mouth 3 (three) times daily. 07/28/16   Charlestine Night, PA-C  HYDROcodone-acetaminophen (NORCO/VICODIN) 5-325 MG per tablet Take 1-2 tablets by mouth every 4 (four) hours as needed. Patient not taking: Reported on 07/28/2016 09/30/14   Elpidio Anis, PA-C  HYDROcodone-acetaminophen (NORCO/VICODIN) 5-325 MG tablet Take 1 tablet by mouth every 4 (four) hours as needed. Patient not taking: Reported on  07/28/2016 05/16/16   Barrett Henle, PA-C  ibuprofen (ADVIL,MOTRIN) 800 MG tablet Take 1 tablet (800 mg total) by mouth every 8 (eight) hours as needed. 07/28/16   Charlestine Night, PA-C  methocarbamol (ROBAXIN) 500 MG tablet Take 2 tablets (1,000 mg total) by mouth 2 (two) times daily. Patient not taking: Reported on 07/28/2016 05/21/15   Elpidio Anis, PA-C  oxyCODONE-acetaminophen (PERCOCET/ROXICET) 5-325 MG tablet Take 1 tablet by mouth every 6 (six) hours as needed for severe pain. 07/28/16   Charlestine Night, PA-C    Family History Family History  Problem Relation Age of Onset  . Adopted: Yes    Social History Social History  Substance Use Topics  . Smoking status: Current Every Day Smoker    Packs/day: 0.50    Types: Cigarettes  . Smokeless tobacco: Current User    Types: Snuff  . Alcohol use Yes     Comment: occasionally     Allergies   Review of patient's allergies indicates no known allergies.   Review of Systems Review of Systems All other systems negative except as documented in the HPI. All pertinent positives and negatives as reviewed in the HPI.  Physical Exam Updated Vital Signs BP 129/73   Pulse 74   Temp 100.6 F (38.1 C) (Oral)   Resp 16   SpO2 97%   Physical Exam  Constitutional: He is oriented to person, place, and time. He appears well-developed and well-nourished. No distress.  HENT:  Head: Normocephalic and atraumatic.  Mouth/Throat: Uvula is midline and oropharynx is clear and moist. No trismus in  the jaw. Abnormal dentition. Dental abscesses and dental caries present. No uvula swelling.  Eyes: Pupils are equal, round, and reactive to light.  Neck: Normal range of motion. Neck supple.  Cardiovascular: Normal rate, regular rhythm and normal heart sounds.  Exam reveals no gallop and no friction rub.   No murmur heard. Pulmonary/Chest: Effort normal and breath sounds normal. No respiratory distress. He has no wheezes.  Abdominal:  Soft. Bowel sounds are normal. He exhibits no distension. There is no tenderness.  Neurological: He is alert and oriented to person, place, and time. He exhibits normal muscle tone. Coordination normal.  Skin: Skin is warm and dry. No rash noted. No erythema.  Psychiatric: He has a normal mood and affect. His behavior is normal.  Nursing note and vitals reviewed.    ED Treatments / Results  Labs (all labs ordered are listed, but only abnormal results are displayed) Labs Reviewed  CBC WITH DIFFERENTIAL/PLATELET - Abnormal; Notable for the following:       Result Value   WBC 17.4 (*)    Platelets 407 (*)    Neutro Abs 12.5 (*)    Monocytes Absolute 2.1 (*)    All other components within normal limits  BASIC METABOLIC PANEL    EKG  EKG Interpretation None       Radiology Ct Soft Tissue Neck W Contrast  Result Date: 07/28/2016 CLINICAL DATA:  30 year old male with right side dental pain and soft tissue swelling. Difficulty swallowing. Possible dental abscess. Initial encounter. EXAM: CT NECK WITH CONTRAST TECHNIQUE: Multidetector CT imaging of the neck was performed using the standard protocol following the bolus administration of intravenous contrast. CONTRAST:  75mL ISOVUE-300 IOPAMIDOL (ISOVUE-300) INJECTION 61% COMPARISON:  None. FINDINGS: Pharynx and larynx: Negative larynx. Pharyngeal contours are within normal limits. Negative parapharyngeal and retropharyngeal spaces. Salivary glands: The sublingual space appears to remain normal. The right sublingual gland is mildly hyper enhancing. Negative left submandibular gland and left parotid gland. There is inflammation throughout the right buccal space and overlying superficial soft tissues, and inflammation in the right submandibular space with an indistinct appearance of the right submandibular gland. There is right level 1 B and level 2A lymphadenopathy with individual nodes up to 17 mm short axis. The facial inflammation affects the  course of the right parotid duct, but the right parotid gland does not appear inflamed. There is mild inflammatory fat stranding in the right masticator space. There is also no mandible dental abnormality identified. However, there poor anterior maxillary dentition, with numerous anterior maxillary dental caries and several areas of periapical tooth lucency. This includes periapical lucency at both right maxillary incisors, and just lateral to the level of the right maxillary lateral incisor there is an indistinct hypodense area along the right upper lip soft tissues encompassing 11 x 13 x 14 mm (AP by transverse by CC). See series 201 image 41. Thyroid: Negative. Lymph nodes: Right level 1 and level 2 reactive lymphadenopathy with enlarged and mildly hyper enhancing nodes. Left side nodes and other nodal stations appear normal for age. Vascular: Major vascular structures in the neck and at the skullbase are patent, including the right IJ. The left IJ is dominant. The right EJ is patent. Limited intracranial: Negative. Visualized orbits: Negative. Mastoids and visualized paranasal sinuses: Subtotal right sphenoid sinus opacification. Scattered bilateral ethmoid sinus opacification. Mild maxillary sinus alveolar recess mucosal thickening is greater on the left. Tympanic cavities and mastoids are clear, aside from some chronic left mastoid sclerosis. Skeleton: Dental  findings reported above. Otherwise no acute osseous abnormality identified. Upper chest: Negative visualized superior mediastinum. Negative lung apices. Other: None. IMPRESSION: 1. Right face soft tissue swelling and inflammation extending from the right buccal space to the right masticator and submandibular spaces. Along the right anterior superior buccal space near the upper lip there is a 14 mm phlegmon / developing Abscess (series 21, image 41 and sagittal image 42). However, this may not yet be drainable. 2. Poor maxillary dentition. Right maxillary  incisor infection source suspected. 3. Secondary inflammation of the right submandibular gland. Reactive and fairly bulky right level 1 and level 2 lymphadenopathy. Electronically Signed   By: Odessa Fleming M.D.   On: 07/28/2016 14:05    Procedures Procedures (including critical care time)  Medications Ordered in ED Medications  morphine 4 MG/ML injection 4 mg (4 mg Intravenous Given 07/28/16 1242)  ondansetron (ZOFRAN) injection 4 mg (4 mg Intravenous Given 07/28/16 1239)  clindamycin (CLEOCIN) IVPB 900 mg (0 mg Intravenous Stopped 07/28/16 1324)  iopamidol (ISOVUE-300) 61 % injection (75 mLs  Contrast Given 07/28/16 1334)     Initial Impression / Assessment and Plan / ED Course  I have reviewed the triage vital signs and the nursing notes.  Pertinent labs & imaging results that were available during my care of the patient were reviewed by me and considered in my medical decision making (see chart for details).  Clinical Course    Patient will be given IV antibiotics.  I have also referred him to our dentistry on call.  Given clindamycin for home.  Told to rinse with warm water and peroxide 3 times a day.  Patient agrees the plan and all questions were answered in stable here in the emergency department.  Vital signs have improved and stable, as well  Final Clinical Impressions(s) / ED Diagnoses   Final diagnoses:  Dental abscess    New Prescriptions Discharge Medication List as of 07/28/2016  4:11 PM    START taking these medications   Details  clindamycin (CLEOCIN) 150 MG capsule Take 3 capsules (450 mg total) by mouth 3 (three) times daily., Starting Fri 07/28/2016, Print    oxyCODONE-acetaminophen (PERCOCET/ROXICET) 5-325 MG tablet Take 1 tablet by mouth every 6 (six) hours as needed for severe pain., Starting Fri 07/28/2016, Print         Charlestine Night, PA-C 07/30/16 1651    Courteney Randall An, MD 07/31/16 [phone]

## 2016-08-13 ENCOUNTER — Encounter (HOSPITAL_COMMUNITY): Payer: Self-pay | Admitting: *Deleted

## 2016-08-13 ENCOUNTER — Emergency Department (HOSPITAL_COMMUNITY): Payer: Self-pay

## 2016-08-13 ENCOUNTER — Emergency Department (HOSPITAL_COMMUNITY)
Admission: EM | Admit: 2016-08-13 | Discharge: 2016-08-13 | Disposition: A | Discharge status: Home / Self Care | Payer: Self-pay | Attending: Emergency Medicine | Admitting: Emergency Medicine

## 2016-08-13 DIAGNOSIS — Y929 Unspecified place or not applicable: Secondary | ICD-10-CM | POA: Insufficient documentation

## 2016-08-13 DIAGNOSIS — X509XXA Other and unspecified overexertion or strenuous movements or postures, initial encounter: Secondary | ICD-10-CM | POA: Insufficient documentation

## 2016-08-13 DIAGNOSIS — Y9339 Activity, other involving climbing, rappelling and jumping off: Secondary | ICD-10-CM | POA: Insufficient documentation

## 2016-08-13 DIAGNOSIS — Y999 Unspecified external cause status: Secondary | ICD-10-CM | POA: Insufficient documentation

## 2016-08-13 DIAGNOSIS — F17210 Nicotine dependence, cigarettes, uncomplicated: Secondary | ICD-10-CM | POA: Insufficient documentation

## 2016-08-13 DIAGNOSIS — M79671 Pain in right foot: Secondary | ICD-10-CM

## 2016-08-13 MED ORDER — HYDROCODONE-ACETAMINOPHEN 5-325 MG PO TABS: 1.0000 | ORAL_TABLET | Freq: Four times a day (QID) | ORAL | 0 refills | Status: DC | PRN

## 2016-08-13 NOTE — ED Triage Notes (Signed)
Pt complains of right foot pain since jumping down from a 2.675ft tall porch 3 days ago. Pt denies ankle pain, pt states pain is on plantar side of foot. Pt states he heard a pop and a crack when jumping down from porch.

## 2016-08-13 NOTE — Discharge Instructions (Signed)
Medications: Norco  Treatment: Take Norco every 6 hours as needed for severe pain. Do not drive or operate machinery when taking this medication and only take as prescribed. Do not share this medication with anyone else. Take ibuprofen as prescribed over-the-counter every 4-6 hours for mild-to-moderate pain. Use ice 3-4 times daily alternating 20 minutes on, 20 minutes off. Use crutches to ambulate carefully.  Follow-up: Please follow-up with Dr. Roda ShuttersXu for further evaluation and treatment of your symptoms. Please return to emergency department if you develop any new or worsening symptoms.

## 2016-08-13 NOTE — ED Provider Notes (Signed)
[facility]-EMERGENCY DEPT Provider Note   CSN: [id number] Arrival date & time: 08/13/16  1335   By signing my name below, I, Lennie Muckleyan Watts, attest that this documentation has been prepared under the direction and in the presence of Buel ReamAlexandra Roseland Braun, PA-C. Electronically Signed: Lennie Muckleyan Watts, Scribe. 08/13/16. 3:09 PM.    History   Chief Complaint Chief Complaint  Patient presents with  . Foot Pain   The history is provided by the patient. No language interpreter was used.  Foot Pain  Pertinent negatives include no chest pain, no abdominal pain, no headaches and no shortness of breath.     HPI Comments: Tyler CoonRobert Ryan is a 30 y.o. male who presents to the Emergency Department complaining of right foot pain after jumping off of a 2.5 foot tall porch 3 days ago. Pain is on the plantar side of the foot and thinks he heard a pop and crack when landing on the ground. It is painful to walk and puts his weight on his heels. Has numbness and tingling in the right foot on the plantar surface and in the toes. Denies ankle pain. Is not able to move his toes due to pain and swelling. Has been icing the foot and soaking it in epsom salt.  Denies fever, nausea, vomiting, chest pain, or abdominal pain.  Past Medical History:  Diagnosis Date  . Intermittent explosive disorder     There are no active problems to display for this patient.   History reviewed. No pertinent surgical history.     Home Medications    Prior to Admission medications   Medication Sig Start Date End Date Taking? Authorizing Provider  cephALEXin (KEFLEX) 500 MG capsule Take 1 capsule (500 mg total) by mouth 4 (four) times daily. Patient not taking: Reported on 07/28/2016 09/30/14   Elpidio AnisShari Upstill, PA-C  clindamycin (CLEOCIN) 150 MG capsule Take 3 capsules (450 mg total) by mouth 3 (three) times daily. 07/28/16   Charlestine Nighthristopher Lawyer, PA-C  HYDROcodone-acetaminophen (NORCO/VICODIN) 5-325 MG tablet Take 1-2 tablets by mouth every 6 (six)  hours as needed. 08/13/16   Emi HolesAlexandra M Les Longmore, PA-C  ibuprofen (ADVIL,MOTRIN) 800 MG tablet Take 1 tablet (800 mg total) by mouth every 8 (eight) hours as needed. 07/28/16   Charlestine Nighthristopher Lawyer, PA-C  methocarbamol (ROBAXIN) 500 MG tablet Take 2 tablets (1,000 mg total) by mouth 2 (two) times daily. Patient not taking: Reported on 07/28/2016 05/21/15   Elpidio AnisShari Upstill, PA-C  oxyCODONE-acetaminophen (PERCOCET/ROXICET) 5-325 MG tablet Take 1 tablet by mouth every 6 (six) hours as needed for severe pain. 07/28/16   Charlestine Nighthristopher Lawyer, PA-C    Family History Family History  Problem Relation Age of Onset  . Adopted: Yes    Social History Social History  Substance Use Topics  . Smoking status: Current Every Day Smoker    Packs/day: 0.50    Types: Cigarettes  . Smokeless tobacco: Current User    Types: Snuff  . Alcohol use Yes     Comment: occasionally     Allergies   Review of patient's allergies indicates no known allergies.   Review of Systems Review of Systems  Constitutional: Negative for chills and fever.  HENT: Negative for facial swelling and sore throat.   Respiratory: Negative for shortness of breath.   Cardiovascular: Negative for chest pain.  Gastrointestinal: Negative for abdominal pain, nausea and vomiting.  Genitourinary: Negative for dysuria.  Musculoskeletal: Negative for back pain.       Right foot pain, no ankle pain  Skin:  Negative for rash and wound.  Neurological: Negative for headaches.       Numbness and tingling on right plantar surface of foot and toes  Psychiatric/Behavioral: The patient is not nervous/anxious.   All other systems reviewed and are negative.    Physical Exam Updated Vital Signs BP 149/97 (BP Location: Left Arm)   Pulse 71   Temp 98.2 F (36.8 C)   Resp 18   Ht 5\' 11"  (1.803 m)   Wt 77.8 kg   SpO2 99%   BMI 23.93 kg/m   Physical Exam  Constitutional: He appears well-developed and well-nourished. No distress.  HENT:  Head:  Normocephalic and atraumatic.  Mouth/Throat: Oropharynx is clear and moist. No oropharyngeal exudate.  Eyes: Conjunctivae are normal. Pupils are equal, round, and reactive to light. Right eye exhibits no discharge. Left eye exhibits no discharge. No scleral icterus.  Neck: Normal range of motion. Neck supple. No thyromegaly present.  Cardiovascular: Normal rate, regular rhythm, normal heart sounds and intact distal pulses.  Exam reveals no gallop and no friction rub.   No murmur heard. Pulmonary/Chest: Effort normal and breath sounds normal. No stridor. No respiratory distress. He has no wheezes. He has no rales.  Abdominal: Soft. Bowel sounds are normal. He exhibits no distension. There is no tenderness. There is no rebound and no guarding.  Musculoskeletal: He exhibits no edema.  Normal sensation of right foot, patient will not flex toes due to pain, tenderness to metatarsal joints on the plantar surface, no dorsal tenderness, no tenderness to ankle or base of the 5th metatarsal; Moderate edema to toes and distal foot  Lymphadenopathy:    He has no cervical adenopathy.  Neurological: He is alert. Coordination normal.  Skin: Skin is warm and dry. Capillary refill takes less than 2 seconds. No rash noted. He is not diaphoretic. No pallor.  Psychiatric: He has a normal mood and affect.  Nursing note and vitals reviewed.    ED Treatments / Results  Labs (all labs ordered are listed, but only abnormal results are displayed) Labs Reviewed - No data to display  EKG  EKG Interpretation None       Radiology Dg Foot Complete Right  Result Date: 08/13/2016 CLINICAL DATA:  30 year old male with right foot pain after jumping off porch 3 days ago. Initial encounter. EXAM: RIGHT FOOT COMPLETE - 3+ VIEW COMPARISON:  01/09/2015. FINDINGS: There is no evidence of fracture or dislocation. There is no evidence of arthropathy or other focal bone abnormality. Soft tissues are unremarkable. IMPRESSION:  Negative. Electronically Signed   By: Lacy Duverney M.D.   On: 08/13/2016 15:00    Procedures Procedures (including critical care time)  Medications Ordered in ED Medications - No data to display   Initial Impression / Assessment and Plan / ED Course  I have reviewed the triage vital signs and the nursing notes.  Pertinent labs & imaging results that were available during my care of the patient were reviewed by me and considered in my medical decision making (see chart for details).  Clinical Course   Patient X-Ray negative for obvious fracture or dislocation.  Pt advised to follow up with orthopedics as needed. Patient given a post op shoe and crutches while in ED, conservative therapy recommended and discussed. Discharged with short course of Norco. Advised not to drive or operate machinery when taking this medication only take as prescribed. Over-the-counter medications discussed to supplement. Advised icing and elevating the right foot. Patient will be discharged home &  is agreeable with above plan. Returns precautions discussed. Pt appears safe for discharge.  I personally performed the services described in this documentation, which was scribed in my presence. The recorded information has been reviewed and is accurate.   Final Clinical Impressions(s) / ED Diagnoses   Final diagnoses:  Foot pain, right    New Prescriptions Discharge Medication List as of 08/13/2016  3:18 PM       Emi Holes, PA-C 08/14/16 1018    Rolan Bucco, MD 08/18/16 [phone]

## 2016-08-20 ENCOUNTER — Encounter (INDEPENDENT_AMBULATORY_CARE_PROVIDER_SITE_OTHER): Payer: Self-pay | Admitting: Orthopaedic Surgery

## 2016-08-21 ENCOUNTER — Ambulatory Visit (INDEPENDENT_AMBULATORY_CARE_PROVIDER_SITE_OTHER): Payer: Self-pay

## 2016-08-21 ENCOUNTER — Ambulatory Visit (INDEPENDENT_AMBULATORY_CARE_PROVIDER_SITE_OTHER): Payer: Self-pay | Admitting: Family

## 2016-08-21 ENCOUNTER — Encounter (INDEPENDENT_AMBULATORY_CARE_PROVIDER_SITE_OTHER): Payer: Self-pay | Admitting: Orthopedic Surgery

## 2016-08-21 DIAGNOSIS — M25571 Pain in right ankle and joints of right foot: Secondary | ICD-10-CM

## 2016-08-21 NOTE — Progress Notes (Signed)
Office Visit Note   Patient: Tyler CoonRobert Ryan           Date of Birth: 06/29/1986           MRN: [id number] Visit Date: 08/21/2016              Requested by: No referring provider defined for this encounter. PCP: No PCP Per Patient   Assessment & Plan: Visit Diagnoses:  1. Pain in right ankle and joints of right foot     Plan: Advance weightbearing in regular shoewear as tolerated. Him may continue to use ibuprofen for pain. Declined refilling his Norco today. We'll follow up in the office in 4 more weeks.  Follow-Up Instructions: Return in about 4 weeks (around 09/18/2016).   Orders:  Orders Placed This Encounter  Procedures  . XR Foot Complete Right   No orders of the defined types were placed in this encounter.     Procedures: No procedures performed   Clinical Data: No additional findings.   Subjective: Chief Complaint  Patient presents with  . Right Foot - Pain, Injury  . Foot Injury    08/10/16    Patient is following up from hospital emergency room visit on 08/13/2016 foot pain after jumping off of a 2.5 foot tall porch on 08/10/2016. Patient did not go anywhere at time of initial injury but as swelling and pain persisted so he decided to go to emergency room. He is currently nonweightbearing with crutches. Pain and numbness and sharp pain. He contacted our office through mychart and stated his foot was turning black. There is dark discoloration of skin at toe areas. He has shortness of breath with pain. He is unable to bend toes at all.   States is unable to move his foot him or wiggle his toes.  Review of Systems  Constitutional: Negative for chills and fatigue.  All other systems reviewed and are negative.    Objective: Vital Signs: There were no vitals taken for this visit.  Physical Exam  Cardiovascular:  Pulses:      Dorsalis pedis pulses are 3+ on the right side.  Musculoskeletal:       Right foot: There is no deformity.  Feet:  Right Foot:   Skin Integrity: Negative for ulcer, blister, skin breakdown, erythema, warmth or callus.   poor effort upon examination will not dorsiflex ankle or plantarflex foot. Will not wiggle toes either. Has great strength, resisting my exam, resists dorsiflexion and plantarflexion. Toes are warm, Cap refill < 3 sec all toes. Inconsistent tenderness beneath metatarsal heads upon exam. Achilles is nontender, no palpable cords or defects.   Ortho Exam  Specialty Comments:  No specialty comments available.  Imaging: Xr Foot Complete Right  Result Date: 08/21/2016 3 views of the right foot are negative for fracture or bony abnormality    PMFS History: There are no active problems to display for this patient.  Past Medical History:  Diagnosis Date  . Intermittent explosive disorder     Family History  Problem Relation Age of Onset  . Adopted: Yes    History reviewed. No pertinent surgical history. Social History   Occupational History  . Not on file.   Social History Main Topics  . Smoking status: Current Every Day Smoker    Packs/day: 0.50    Types: Cigarettes  . Smokeless tobacco: Current User    Types: Snuff  . Alcohol use Yes     Comment: occasionally  . Drug use: No  .  Sexual activity: Not on file

## 2016-08-29 ENCOUNTER — Ambulatory Visit (INDEPENDENT_AMBULATORY_CARE_PROVIDER_SITE_OTHER): Payer: Self-pay | Admitting: Orthopaedic Surgery

## 2016-09-02 ENCOUNTER — Emergency Department (HOSPITAL_COMMUNITY)
Admission: EM | Admit: 2016-09-02 | Discharge: 2016-09-02 | Disposition: A | Discharge status: Home / Self Care | Payer: Self-pay | Attending: Emergency Medicine | Admitting: Emergency Medicine

## 2016-09-02 ENCOUNTER — Encounter (HOSPITAL_COMMUNITY): Payer: Self-pay | Admitting: Emergency Medicine

## 2016-09-02 ENCOUNTER — Emergency Department (HOSPITAL_COMMUNITY): Payer: Self-pay

## 2016-09-02 DIAGNOSIS — Z79899 Other long term (current) drug therapy: Secondary | ICD-10-CM | POA: Insufficient documentation

## 2016-09-02 DIAGNOSIS — Y999 Unspecified external cause status: Secondary | ICD-10-CM | POA: Insufficient documentation

## 2016-09-02 DIAGNOSIS — X501XXA Overexertion from prolonged static or awkward postures, initial encounter: Secondary | ICD-10-CM | POA: Insufficient documentation

## 2016-09-02 DIAGNOSIS — F17210 Nicotine dependence, cigarettes, uncomplicated: Secondary | ICD-10-CM | POA: Insufficient documentation

## 2016-09-02 DIAGNOSIS — Y9389 Activity, other specified: Secondary | ICD-10-CM | POA: Insufficient documentation

## 2016-09-02 DIAGNOSIS — S63501A Unspecified sprain of right wrist, initial encounter: Secondary | ICD-10-CM | POA: Insufficient documentation

## 2016-09-02 DIAGNOSIS — Y929 Unspecified place or not applicable: Secondary | ICD-10-CM | POA: Insufficient documentation

## 2016-09-02 MED ORDER — IBUPROFEN 800 MG PO TABS: 800.0000 mg | ORAL_TABLET | Freq: Three times a day (TID) | ORAL | 0 refills | Status: DC

## 2016-09-02 MED ORDER — IBUPROFEN 800 MG PO TABS
800.0000 mg | ORAL_TABLET | Freq: Once | ORAL | Status: AC
  Administered 2016-09-02: 800 mg via ORAL
  Filled 2016-09-02: qty 1

## 2016-09-02 NOTE — ED Notes (Signed)
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  States he had tingling after it happened which resolved.

## 2016-09-02 NOTE — ED Triage Notes (Signed)
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

## 2016-09-02 NOTE — ED Provider Notes (Signed)
[facility]-EMERGENCY DEPT Provider Note   CSN: [id number] Arrival date & time: 09/02/16  1417  By signing my name below, I, Avnee Patel, attest that this documentation has been prepared under the direction and in the presence of  Cheri FowlerKayla Breyanna Valera, PA-C. Electronically Signed: Clovis PuAvnee Patel, ED Scribe. 09/02/16. 4:21 PM.   History   Chief Complaint Chief Complaint  Patient presents with  . Wrist Injury   The history is provided by the patient. No language interpreter was used.   HPI Comments:  Tyler Ryan is a 30 y.o. male who presents to the Emergency Department complaining of sudden onset, moderate, shooting right wrist pain s/p an incident which occurred in the PM yesterday. Pt state he was drilling into a concrete wall when he lost hold of the drill and twisted his wrist. He notes his pain is worse with movement and radiates up his forearm. Pt notes associated tingling to the area. He has iced the area and taken 400 mg ibuprofen with no relief last night. Pt denies any medical problems and any other complaints at this time.   Past Medical History:  Diagnosis Date  . Intermittent explosive disorder     There are no active problems to display for this patient.   History reviewed. No pertinent surgical history.   Home Medications    Prior to Admission medications   Medication Sig Start Date End Date Taking? Authorizing Provider  cephALEXin (KEFLEX) 500 MG capsule Take 1 capsule (500 mg total) by mouth 4 (four) times daily. Patient not taking: Reported on 08/21/2016 09/30/14   Elpidio AnisShari Upstill, PA-C  clindamycin (CLEOCIN) 150 MG capsule Take 3 capsules (450 mg total) by mouth 3 (three) times daily. Patient not taking: Reported on 08/21/2016 07/28/16   Charlestine Nighthristopher Lawyer, PA-C  HYDROcodone-acetaminophen (NORCO/VICODIN) 5-325 MG tablet Take 1-2 tablets by mouth every 6 (six) hours as needed. Patient not taking: Reported on 08/21/2016 08/13/16   Emi HolesAlexandra M Law, PA-C  ibuprofen (ADVIL,MOTRIN)  800 MG tablet Take 1 tablet (800 mg total) by mouth 3 (three) times daily. 09/02/16   Cheri FowlerKayla Sebastion Jun, PA-C  methocarbamol (ROBAXIN) 500 MG tablet Take 2 tablets (1,000 mg total) by mouth 2 (two) times daily. Patient not taking: Reported on 08/21/2016 05/21/15   Elpidio AnisShari Upstill, PA-C  oxyCODONE-acetaminophen (PERCOCET/ROXICET) 5-325 MG tablet Take 1 tablet by mouth every 6 (six) hours as needed for severe pain. Patient not taking: Reported on 08/21/2016 07/28/16   Charlestine Nighthristopher Lawyer, PA-C    Family History Family History  Problem Relation Age of Onset  . Adopted: Yes    Social History Social History  Substance Use Topics  . Smoking status: Current Every Day Smoker    Packs/day: 0.50    Types: Cigarettes  . Smokeless tobacco: Current User    Types: Snuff  . Alcohol use Yes     Comment: occasionally     Allergies   Patient has no known allergies.   Review of Systems Review of Systems  Musculoskeletal: Positive for arthralgias and myalgias. Negative for joint swelling.  Skin: Negative for color change and wound.  Neurological: Negative for numbness.  All other systems reviewed and are negative.    Physical Exam Updated Vital Signs BP 142/80 (BP Location: Right Arm)   Pulse 74   Temp 98 F (36.7 C) (Oral)   Resp 16   SpO2 100%   Physical Exam  Constitutional: He is oriented to person, place, and time. He appears well-developed and well-nourished. No distress.  HENT:  Head: Normocephalic  and atraumatic.  Eyes: Conjunctivae are normal.  Cardiovascular: Normal rate.   Pulses:      Radial pulses are 2+ on the right side, and 2+ on the left side.  Brisk capillary refill   Pulmonary/Chest: Effort normal.  Abdominal: He exhibits no distension.  Musculoskeletal: He exhibits tenderness. He exhibits no edema.  Tenderness over dorsal aspect of distal radius and ulna. No anatomical snuff box tenderness. Decreased ROM in flexion and extension due to pain. No swelling, bruising or  wound. Compartments soft and compressible   Neurological: He is alert and oriented to person, place, and time.  Sensation intact. Strength testing deferred due to pain.   Skin: Skin is warm and dry.  Psychiatric: He has a normal mood and affect.  Nursing note and vitals reviewed.    ED Treatments / Results  DIAGNOSTIC STUDIES:  Oxygen Saturation is 100% on RA, normal by my interpretation.    COORDINATION OF CARE:  4:08 PM Discussed treatment plan with pt at bedside and pt agreed to plan.  Labs (all labs ordered are listed, but only abnormal results are displayed) Labs Reviewed - No data to display  EKG  EKG Interpretation None       Radiology Dg Wrist Complete Right  Result Date: 09/02/2016 CLINICAL DATA:  Right wrist pain. 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 FINDINGS: There is no evidence of fracture or dislocation. There is no evidence of arthropathy or other focal bone abnormality. Soft tissues are unremarkable. IMPRESSION: Negative. Electronically Signed   By: Signa Kellaylor  Stroud M.D.   On: 09/02/2016 14:51    Procedures Procedures (including critical care time)  Medications Ordered in ED Medications  ibuprofen (ADVIL,MOTRIN) tablet 800 mg (800 mg Oral Given 09/02/16 1620)     Initial Impression / Assessment and Plan / ED Course  I have reviewed the triage vital signs and the nursing notes.  Pertinent labs & imaging results that were available during my care of the patient were reviewed by me and considered in my medical decision making (see chart for details).  Clinical Course    Patient X-Ray negative for obvious fracture or dislocation. Pt advised to follow up with hand surgery if pain persists. Patient given brace while in ED, conservative therapy recommended and discussed. Patient will be discharged home & is agreeable with above plan. Returns precautions discussed. Pt appears safe for  discharge.  Final Clinical Impressions(s) / ED Diagnoses   Final diagnoses:  Sprain of right wrist, initial encounter    New Prescriptions New Prescriptions   IBUPROFEN (ADVIL,MOTRIN) 800 MG TABLET    Take 1 tablet (800 mg total) by mouth 3 (three) times daily.   I personally performed the services described in this documentation, which was scribed in my presence. The recorded information has been reviewed and is accurate.     Cheri FowlerKayla Carnesha Maravilla, PA-C 09/02/16 1708    Donnetta HutchingBrian Cook, MD 09/03/16 [phone]

## 2016-09-02 NOTE — Discharge Instructions (Signed)
Take Naprosyn twice daily for pain. Apply ice for 10-20 minutes 3 times daily. Where your splint while working. If your symptoms persist over the next 1-2 weeks follow-up with hand surgery. Return to the emergency department for any increased swelling with associated color changes or numbness, or any new or concerning symptoms.

## 2016-09-04 ENCOUNTER — Ambulatory Visit (INDEPENDENT_AMBULATORY_CARE_PROVIDER_SITE_OTHER): Payer: Self-pay | Admitting: Orthopedic Surgery

## 2016-09-06 ENCOUNTER — Encounter (INDEPENDENT_AMBULATORY_CARE_PROVIDER_SITE_OTHER): Payer: Self-pay | Admitting: Orthopedic Surgery

## 2016-09-06 ENCOUNTER — Ambulatory Visit (INDEPENDENT_AMBULATORY_CARE_PROVIDER_SITE_OTHER): Payer: Self-pay | Admitting: Orthopedic Surgery

## 2016-09-06 VITALS — Ht 71.0 in | Wt 171.0 lb

## 2016-09-06 DIAGNOSIS — M25531 Pain in right wrist: Secondary | ICD-10-CM

## 2016-09-06 NOTE — Progress Notes (Signed)
   Office Visit Note   Patient: Tyler CoonRobert Ryan           Date of Birth: 08/19/1986           MRN: [id number] Visit Date: 09/06/2016              Requested by: No referring provider defined for this encounter. PCP: No PCP Per Patient   Assessment & Plan: Visit Diagnoses:  1. Pain in right wrist   Possible sprain of the scapholunate interval.  Plan: We will place him in a thumb spica splint out of work for 3 weeks follow-up in 3 weeks. Patient's scaphoid is nontender to palpation he is primarily tender to palpation midline in the wrist on the volar aspect. The wrist does have some ecchymosis and bruising  Follow-Up Instructions: Return in about 3 weeks (around 09/27/2016).   Orders:  No orders of the defined types were placed in this encounter.  No orders of the defined types were placed in this encounter.     Procedures: No procedures performed   Clinical Data: No additional findings.   Subjective: Chief Complaint  Patient presents with  . Right Ankle - Follow-up    DOI 08/10/2016    Patient is following up on two separate issues one for his right wrist and the second for his right foot. His right foot injury was on 08/10/2016 he had jumped off a porch. He states foot is doing better. But now has had additional injury to right wrist. 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 [facility], imaging was done and negative for any fractures or abnormality. He is taking ibuprofen for pain and states its not helping him at all. He is wearing wrist brace and states this hurts him more. He is right hand dominant.     Review of Systems   Objective: Vital Signs: Ht 5\' 11"  (1.803 m)   Wt 171 lb (77.6 kg)   BMI 23.85 kg/m   Physical Exam patient's radiographs reviewed of the right foot and right wrist. Right wrist shows no evidence of a scaphoid fracture no scapholunate disassociation. Lateral radiograph shows no  DISI deformity patient has good function of the FDS and FDP tendons full active extension of the fingers he has active flexion and extension of the wrist with no ulnar or radial deviation. He has tender palpation over the scapholunate interval.  Ortho Exam  Specialty Comments:  No specialty comments available.  Imaging: No results found.   PMFS History: There are no active problems to display for this patient.  Past Medical History:  Diagnosis Date  . Intermittent explosive disorder     Family History  Problem Relation Age of Onset  . Adopted: Yes    History reviewed. No pertinent surgical history. Social History   Occupational History  . Not on file.   Social History Main Topics  . Smoking status: Current Every Day Smoker    Packs/day: 0.50    Types: Cigarettes  . Smokeless tobacco: Current User    Types: Snuff  . Alcohol use Yes     Comment: occasionally  . Drug use: No  . Sexual activity: Not on file

## 2016-09-27 ENCOUNTER — Ambulatory Visit (INDEPENDENT_AMBULATORY_CARE_PROVIDER_SITE_OTHER): Payer: Self-pay | Admitting: Orthopedic Surgery

## 2016-09-27 ENCOUNTER — Ambulatory Visit (INDEPENDENT_AMBULATORY_CARE_PROVIDER_SITE_OTHER): Payer: Self-pay

## 2016-09-27 VITALS — Ht 71.0 in | Wt 171.0 lb

## 2016-09-27 DIAGNOSIS — M25531 Pain in right wrist: Secondary | ICD-10-CM

## 2016-09-27 NOTE — Progress Notes (Signed)
Office Visit Note   Patient: Tyler CoonRobert Apuzzo           Date of Birth: 11/11/1985           MRN: [id number] Visit Date: 09/27/2016              Requested by: No referring provider defined for this encounter. PCP: No PCP Per Patient   Assessment & Plan: Visit Diagnoses:  1. Pain in right wrist     Plan: We'll follow up as needed. Patient states that his probation is up in several weeks and [place] will be moving to [place]. Radiographs shows no evidence of fractures he has some mild widening of the scapholunate interval but he is asymptomatic at this location his pain is primarily volarly over the palmaris longus tendon. FCR and FCU are intact. Continue wearing the thumb spica splint for 4 weeks and follow up with orthopedics in [place] of still symptomatic. Continue with ibuprofen 600 mg 4 times a day as needed with food  Follow-Up Instructions: Return if symptoms worsen or fail to improve.   Orders:  Orders Placed This Encounter  Procedures  . XR Wrist Complete Right   No orders of the defined types were placed in this encounter.     Procedures: No procedures performed   Clinical Data: No additional findings.   Subjective: Chief Complaint  Patient presents with  . Right Wrist - Injury    DOI 09/02/16    Pt is in  Thumb spica splint following a right wrist injury. he states that he is still having pain and that when he showers the wrist will get swollen. He does not report any other problems aside from this and that he is not taking anything for pain.   Injury     Review of Systems   Objective: Vital Signs: Ht 5\' 11"  (1.803 m)   Wt 171 lb (77.6 kg)   BMI 23.85 kg/m   Physical Exam Examination patient is alert oriented no adenopathy well-dressed left breast where for his normal gait. Examination he has no tenderness to palpation over the scapholunate interval or TFCC has mild tenderness to palpation over the scaphoid radiographs are negative for  fracture. He has good function of the FCR and FCU flexor and extensor tendons FDS and FDP are intact in all fingers. First dorsal extensor compartment is nontender on the collateral ligaments of the thumb are nontender and stable. Ortho Exam  Specialty Comments:  No specialty comments available.  Imaging: Xr Wrist Complete Right  Result Date: 09/27/2016 Three-view radiographs of the right wrist shows no evidence of a fracturefor a fracture he does have a little bit of widening of the scapholunate interval but this is asymptomatic clinically. There is no DISI deformity.    PMFS History: There are no active problems to display for this patient.  Past Medical History:  Diagnosis Date  . Intermittent explosive disorder     Family History  Problem Relation Age of Onset  . Adopted: Yes    No past surgical history on file. Social History   Occupational History  . Not on file.   Social History Main Topics  . Smoking status: Current Every Day Smoker    Packs/day: 0.50    Types: Cigarettes  . Smokeless tobacco: Current User    Types: Snuff  . Alcohol use Yes     Comment: occasionally  . Drug use: No  . Sexual activity: Not on file

## 2016-10-14 IMAGING — CR DG ANKLE COMPLETE 3+V*R*
3 series · 3 of 3 positions shown · non-contrast
Comparison: Radiograph 02/18/2011

ADDENDUM:
Dictation correction:
CLINICAL DATA: Right ankle pain for 1 month

EXAM:
RIGHT ANKLE - COMPLETE 3+ VIEW

[x ankle ap right]
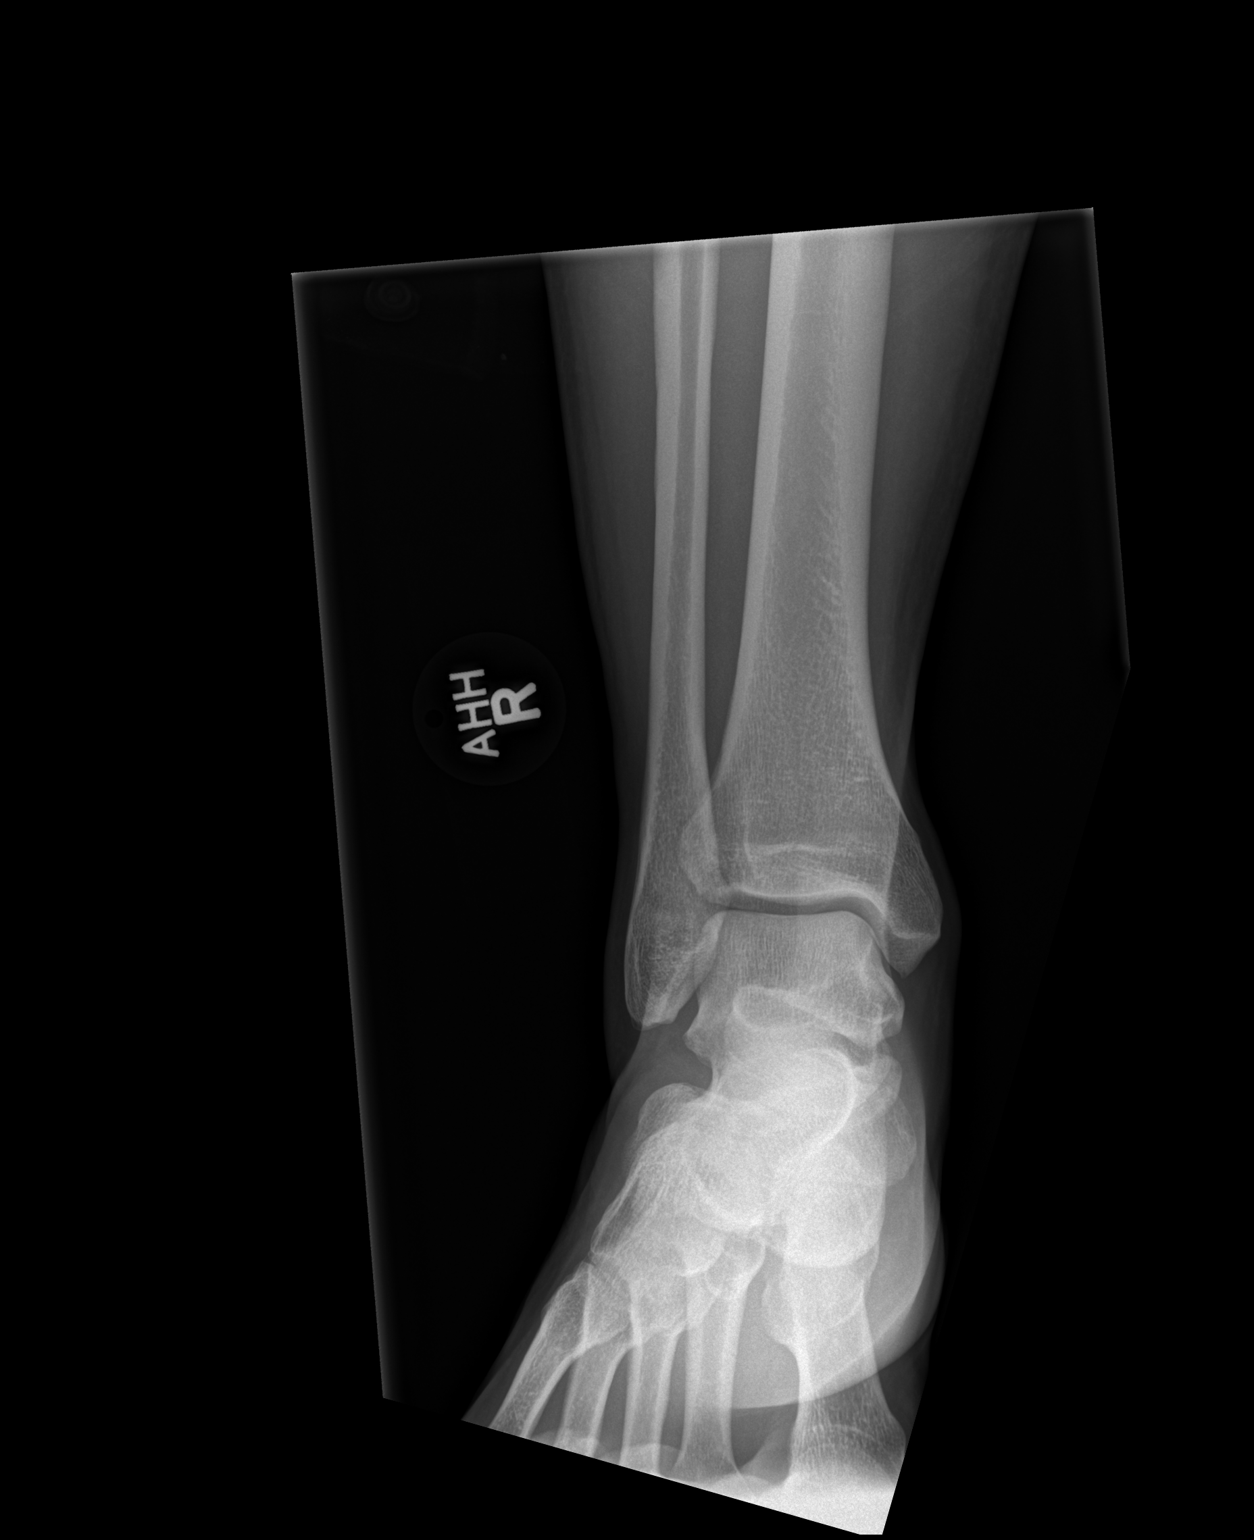

[x ankle obl right]
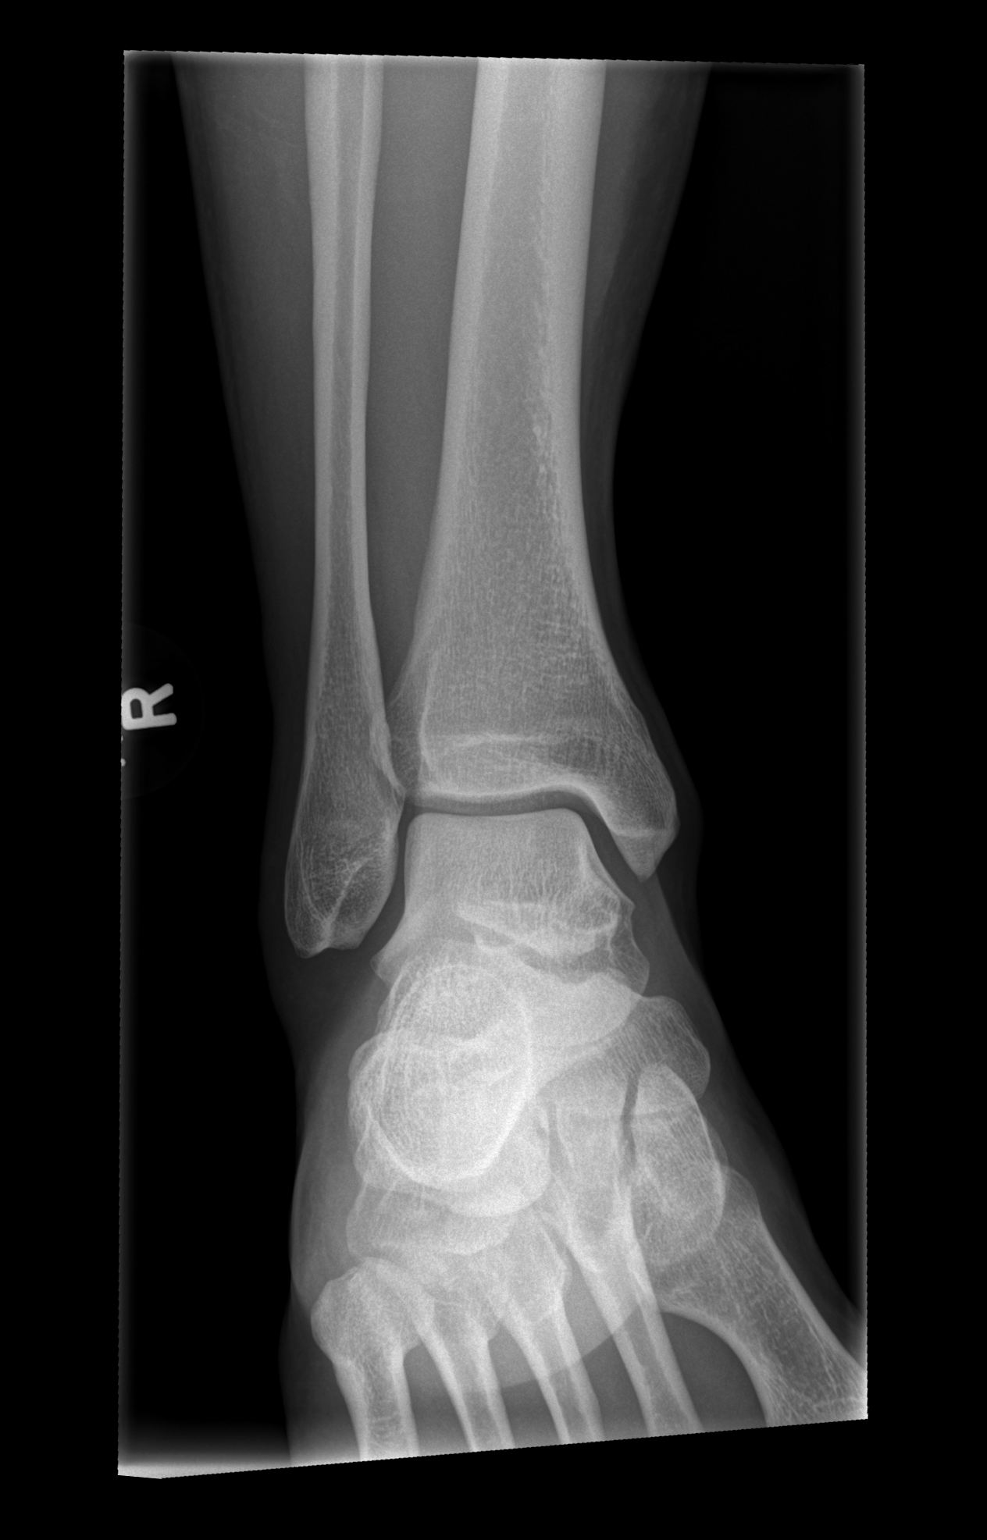

[x ankle lat right]
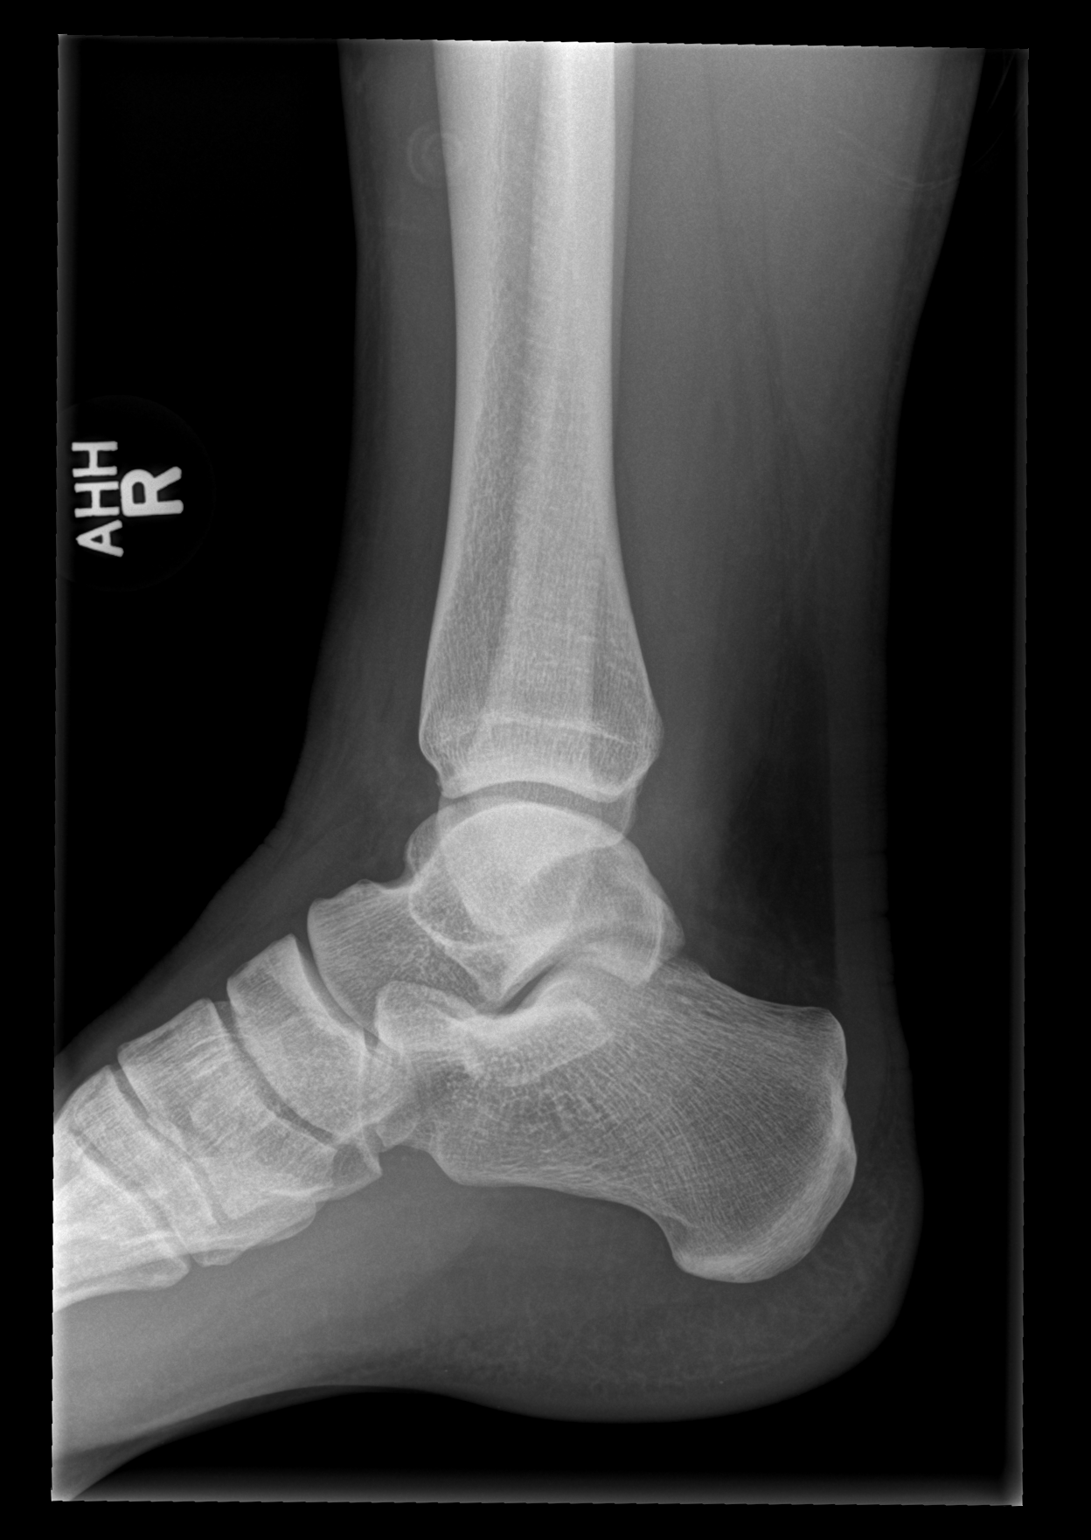

[3 of 3 positions shown; findings below may reference images not displayed]

IMPRESSION: No fracture or dislocation.
FINDINGS: Ankle mortise intact. The talar dome is normal. No malleolar
fracture. The calcaneus is normal.
IMPRESSION: No acute cardiopulmonary process.

## 2016-10-27 ENCOUNTER — Encounter (INDEPENDENT_AMBULATORY_CARE_PROVIDER_SITE_OTHER): Payer: Self-pay | Admitting: Orthopedic Surgery

## 2016-10-27 ENCOUNTER — Ambulatory Visit (INDEPENDENT_AMBULATORY_CARE_PROVIDER_SITE_OTHER): Payer: Self-pay | Admitting: Orthopedic Surgery

## 2016-10-27 VITALS — Ht 71.0 in | Wt 171.0 lb

## 2016-10-27 DIAGNOSIS — S63501D Unspecified sprain of right wrist, subsequent encounter: Secondary | ICD-10-CM

## 2016-10-27 NOTE — Progress Notes (Signed)
   Office Visit Note   Patient: Tyler CoonRobert Ryan           Date of Birth: 05/26/1986           MRN: [id number] Visit Date: 10/27/2016              Requested by: No referring provider defined for this encounter. PCP: No PCP Per Patient  Chief Complaint  Patient presents with  . Right Wrist - Pain    HPI: Right wrist pain. Pt complains of continued pain and swelling. He is still wearing his short arm velcro splint. Pt states " every time I take a shower my wrist swells and I cna not get splint back on" He states that he takes ibuprofen "and its only OTC"  Autumn L Forrest, RMA  Persistent pain along the palmaris longus right wrist  Assessment & Plan: Visit Diagnoses:  1. Right wrist sprain, subsequent encounter    Pain over the palmaris longus right wrist   Plan: Recommend that he continue with the wrist splint. Recommended out of work for 2 weeks note was provided. He can continue taking the ibuprofen up to 800 mg 3 times a day. Discussed the importance of stopping this medication to be developed any GI symptoms.  Follow-Up Instructions: Return if symptoms worsen or fail to improve.   Ortho Exam Examination patient is alert oriented no adenopathy well-dressed normal affect breast [place] he has a normal gait. Examination patient's wrist is nontender to palpation over the scaphoid scapholunate or TFCC. The first dorsal extensor compartment is nontender to palpation. Examination the wrist the FCR and FCU are nontender to palpation he has good wrist flexion without deviation. He is point tender to palpation over the palmaris longus and this is palpable without any ecchymosis or bruising.  Imaging: No results found.  Orders:  No orders of the defined types were placed in this encounter.  No orders of the defined types were placed in this encounter.    Procedures: No procedures performed  Clinical Data: No additional findings.  Subjective: Review of  Systems  Objective: Vital Signs: Ht 5\' 11"  (1.803 m)   Wt 171 lb (77.6 kg)   BMI 23.85 kg/m   Specialty Comments:  No specialty comments available.  PMFS History: There are no active problems to display for this patient.  Past Medical History:  Diagnosis Date  . Intermittent explosive disorder     Family History  Problem Relation Age of Onset  . Adopted: Yes    No past surgical history on file. Social History   Occupational History  . Not on file.   Social History Main Topics  . Smoking status: Current Every Day Smoker    Packs/day: 0.50    Types: Cigarettes  . Smokeless tobacco: Current User    Types: Snuff  . Alcohol use Yes     Comment: occasionally  . Drug use: No  . Sexual activity: Not on file

## 2016-11-13 ENCOUNTER — Encounter (INDEPENDENT_AMBULATORY_CARE_PROVIDER_SITE_OTHER): Payer: Self-pay | Admitting: Orthopedic Surgery

## 2016-11-13 ENCOUNTER — Ambulatory Visit (INDEPENDENT_AMBULATORY_CARE_PROVIDER_SITE_OTHER): Payer: Self-pay | Admitting: Family

## 2016-11-13 VITALS — Ht 71.0 in | Wt 171.0 lb

## 2016-11-13 DIAGNOSIS — S66911D Strain of unspecified muscle, fascia and tendon at wrist and hand level, right hand, subsequent encounter: Secondary | ICD-10-CM

## 2016-11-13 NOTE — Progress Notes (Signed)
   Office Visit Note   Patient: Tyler CoonRobert Peifer           Date of Birth: 09/23/1986           MRN: [id number] Visit Date: 11/13/2016              Requested by: No referring provider defined for this encounter. PCP: No PCP Per Patient  Chief Complaint  Patient presents with  . Right Wrist - Pain    Right wrist sprain continued complaints of swelling    HPI: Patient is a 31 year old gentleman seen in follow up for right wrist sprain. Complains of continued pain and swelling. He states he is still wearing his short arm velcro splint. Continues to complain of increased swelling the following showering.  denies any numbness tingling or weakness.   Persistent pain along the palmaris longus right wrist  Assessment & Plan: Visit Diagnoses:  1. Strain of right wrist, subsequent encounter     Plan: Recommend that he continue with the wrist splint. He can continue taking the ibuprofen up to 800 mg 3 times a day. Discussed the importance of stopping this medication if he develops any GI symptoms.  Follow-Up Instructions: Return in about 3 weeks (around 12/04/2016).   Ortho Exam  Examination: patient is alert oriented no adenopathy well-dressed normal affect. he has a normal gait. Examination patient's wrist is nontender to palpation over the scaphoid scapholunate or TFCC. He has good wrist flexion without deviation. He is point tender to palpation over the palmaris longus. There is no ecchymosis or bruising. Trace edema appreciable.  Imaging: No results found.  Orders:  No orders of the defined types were placed in this encounter.  No orders of the defined types were placed in this encounter.    Procedures: No procedures performed   Clinical Data: No additional findings.   Subjective: Review of Systems  Constitutional: Negative for chills and fever.  Musculoskeletal: Positive for myalgias.    Objective: Vital Signs: Ht 5\' 11"  (1.803 m)   Wt 171 lb (77.6 kg)   BMI 23.85  kg/m   Specialty Comments:  No specialty comments available.  PMFS History: There are no active problems to display for this patient.  Past Medical History:  Diagnosis Date  . Intermittent explosive disorder     Family History  Problem Relation Age of Onset  . Adopted: Yes    No past surgical history on file. Social History   Occupational History  . Not on file.   Social History Main Topics  . Smoking status: Current Every Day Smoker    Packs/day: 0.50    Types: Cigarettes  . Smokeless tobacco: Current User    Types: Snuff  . Alcohol use Yes     Comment: occasionally  . Drug use: No  . Sexual activity: Not on file

## 2016-12-04 ENCOUNTER — Encounter (HOSPITAL_COMMUNITY): Payer: Self-pay | Admitting: Emergency Medicine

## 2016-12-04 ENCOUNTER — Encounter (INDEPENDENT_AMBULATORY_CARE_PROVIDER_SITE_OTHER): Payer: Self-pay | Admitting: Orthopedic Surgery

## 2016-12-04 ENCOUNTER — Ambulatory Visit (INDEPENDENT_AMBULATORY_CARE_PROVIDER_SITE_OTHER): Payer: Self-pay | Admitting: Orthopedic Surgery

## 2016-12-04 ENCOUNTER — Emergency Department (HOSPITAL_COMMUNITY): Payer: Self-pay

## 2016-12-04 ENCOUNTER — Emergency Department (HOSPITAL_COMMUNITY)
Admission: EM | Admit: 2016-12-04 | Discharge: 2016-12-04 | Disposition: A | Discharge status: Home / Self Care | Payer: Self-pay | Attending: Emergency Medicine | Admitting: Emergency Medicine

## 2016-12-04 DIAGNOSIS — S63501S Unspecified sprain of right wrist, sequela: Secondary | ICD-10-CM | POA: Insufficient documentation

## 2016-12-04 DIAGNOSIS — Y939 Activity, unspecified: Secondary | ICD-10-CM | POA: Insufficient documentation

## 2016-12-04 DIAGNOSIS — R6884 Jaw pain: Secondary | ICD-10-CM | POA: Insufficient documentation

## 2016-12-04 DIAGNOSIS — Y999 Unspecified external cause status: Secondary | ICD-10-CM | POA: Insufficient documentation

## 2016-12-04 DIAGNOSIS — R590 Localized enlarged lymph nodes: Secondary | ICD-10-CM

## 2016-12-04 DIAGNOSIS — F17210 Nicotine dependence, cigarettes, uncomplicated: Secondary | ICD-10-CM | POA: Insufficient documentation

## 2016-12-04 DIAGNOSIS — R591 Generalized enlarged lymph nodes: Secondary | ICD-10-CM | POA: Insufficient documentation

## 2016-12-04 DIAGNOSIS — Y929 Unspecified place or not applicable: Secondary | ICD-10-CM | POA: Insufficient documentation

## 2016-12-04 DIAGNOSIS — Z79899 Other long term (current) drug therapy: Secondary | ICD-10-CM | POA: Insufficient documentation
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
## 2016-12-04 NOTE — ED Notes (Signed)
ED Provider at bedside. 

## 2016-12-04 NOTE — ED Notes (Signed)
Patient transported to CT 
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
## 2016-12-27 ENCOUNTER — Inpatient Hospital Stay: Payer: Self-pay | Admitting: Internal Medicine

## 2016-12-28 ENCOUNTER — Inpatient Hospital Stay: Payer: Self-pay

## 2017-01-11 ENCOUNTER — Emergency Department (HOSPITAL_COMMUNITY)
Admission: EM | Admit: 2017-01-11 | Discharge: 2017-01-12 | Disposition: A | Discharge status: Home / Self Care | Payer: Self-pay | Attending: Emergency Medicine | Admitting: Emergency Medicine

## 2017-01-11 ENCOUNTER — Emergency Department (HOSPITAL_COMMUNITY): Payer: Self-pay

## 2017-01-11 ENCOUNTER — Encounter (HOSPITAL_COMMUNITY): Payer: Self-pay | Admitting: Emergency Medicine

## 2017-01-11 DIAGNOSIS — M25561 Pain in right knee: Secondary | ICD-10-CM | POA: Insufficient documentation

## 2017-01-11 DIAGNOSIS — F17210 Nicotine dependence, cigarettes, uncomplicated: Secondary | ICD-10-CM | POA: Insufficient documentation

## 2017-01-11 MED ORDER — HYDROCODONE-ACETAMINOPHEN 5-325 MG PO TABS
1.0000 | ORAL_TABLET | Freq: Once | ORAL | Status: AC
  Administered 2017-01-12: 1 via ORAL
  Filled 2017-01-11: qty 1

## 2017-01-11 NOTE — ED Triage Notes (Signed)
Pt. reports right knee pain with swelling onset Wednesday , denies injury/ambulatory , pain increases with movement /weight bearing .

## 2017-01-11 NOTE — ED Notes (Signed)
See secondary assessment of NP.
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
## 2017-01-12 MED ORDER — DICLOFENAC SODIUM 50 MG PO TBEC: 50.0000 mg | DELAYED_RELEASE_TABLET | Freq: Two times a day (BID) | ORAL | 0 refills | Status: AC

## 2017-01-16 ENCOUNTER — Ambulatory Visit (INDEPENDENT_AMBULATORY_CARE_PROVIDER_SITE_OTHER): Payer: Self-pay | Admitting: Orthopedic Surgery

## 2017-01-16 ENCOUNTER — Encounter (INDEPENDENT_AMBULATORY_CARE_PROVIDER_SITE_OTHER): Payer: Self-pay | Admitting: Orthopedic Surgery

## 2017-01-16 DIAGNOSIS — M25561 Pain in right knee: Secondary | ICD-10-CM

## 2017-01-16 MED ORDER — METHYLPREDNISOLONE ACETATE 40 MG/ML IJ SUSP
40.0000 mg | INTRAMUSCULAR | Status: AC | PRN
  Administered 2017-01-16: 40 mg via INTRA_ARTICULAR

## 2017-01-16 MED ORDER — LIDOCAINE HCL 1 % IJ SOLN
5.0000 mL | INTRAMUSCULAR | Status: AC | PRN
  Administered 2017-01-16: 5 mL

## 2017-01-16 NOTE — Progress Notes (Signed)
Office Visit Note   Patient: Tyler CoonRobert Ryan           Date of Birth: 02/16/1986           MRN: [id number] Visit Date: 01/16/2017              Requested by: No referring provider defined for this encounter. PCP: No PCP Per Patient   Assessment & Plan: Visit Diagnoses:  1. Acute pain of right knee     Plan:Right knee was injected. He will discontinue the crutches and the knee immobilizer.  Follow-Up Instructions: Return if symptoms worsen or fail to improve.   Orders:  Orders Placed This Encounter  Procedures  . Large Joint Injection/Arthrocentesis   No orders of the defined types were placed in this encounter.     Procedures: Large Joint Inj Date/Time: 01/16/2017 12:56 PM Performed by: DUDA, MARCUS V Authorized by: Nadara MustardUDA, MARCUS V   Consent Given by:  Patient Site marked: the procedure site was marked   Timeout: prior to procedure the correct patient, procedure, and site was verified   Indications:  Pain and diagnostic evaluation Location:  Knee Site:  R knee Prep: patient was prepped and draped in usual sterile fashion   Needle Size:  22 G Needle Length:  1.5 inches Approach:  Anteromedial Ultrasound Guidance: No   Fluoroscopic Guidance: No   Arthrogram: No   Medications:  5 mL lidocaine 1 %; 40 mg methylPREDNISolone acetate 40 MG/ML Aspiration Attempted: No   Patient tolerance:  Patient tolerated the procedure well with no immediate complications     Clinical Data: No additional findings.   Subjective: Chief Complaint  Patient presents with  . Right Knee - Pain    Mr. Tyler Ryan is here today for right knee pain, no known injury.  He has severe pain and swelling to medial portion of right knee.  [facility] and had right knee x-rays.  Patient came in today wearing brace and using crutches.  He was given Diclofenac and states that it has not helped with any symptoms. Tyler AlarErica Brynn Geovani Ryan RT(R)    Review of Systems complete review  of systems negative no fever or chills.   Objective: Vital Signs: There were no vitals taken for this visit.  Physical Exam on examination patient is alert oriented no adenopathy well-dressed normal affect normal respiratory effort he has an antalgic gait examination the right knee there is no effusion. Collaterals and cruciates are stable. He does have callus over his knee with some chronic prepatellar bursal swelling secondary to working on his knees. The joint space is congruent radiographs were reviewed and no evidence of any bony or ligamentous injury.  Ortho Exam  Specialty Comments:  No specialty comments available.  Imaging: No results found.   PMFS History: Patient Active Problem List   Diagnosis Date Noted  . Wrist sprain, right, sequela 12/04/2016   Past Medical History:  Diagnosis Date  . Intermittent explosive disorder     Family History  Problem Relation Age of Onset  . Adopted: Yes    No past surgical history on file. Social History   Occupational History  . Not on file.   Social History Main Topics  . Smoking status: Current Every Day Smoker    Packs/day: 0.50    Types: Cigarettes  . Smokeless tobacco: Current User    Types: Snuff  . Alcohol use Yes     Comment: occasionally  . Drug use: No  .  Sexual activity: Not on file

## 2017-02-25 ENCOUNTER — Emergency Department (HOSPITAL_COMMUNITY)
Admission: EM | Admit: 2017-02-25 | Discharge: 2017-02-25 | Disposition: A | Discharge status: Home / Self Care | Payer: Self-pay | Attending: Emergency Medicine | Admitting: Emergency Medicine

## 2017-02-25 ENCOUNTER — Emergency Department (HOSPITAL_COMMUNITY): Payer: Self-pay

## 2017-02-25 ENCOUNTER — Encounter (HOSPITAL_COMMUNITY): Payer: Self-pay | Admitting: Emergency Medicine

## 2017-02-25 DIAGNOSIS — Z79899 Other long term (current) drug therapy: Secondary | ICD-10-CM | POA: Insufficient documentation

## 2017-02-25 DIAGNOSIS — M79671 Pain in right foot: Secondary | ICD-10-CM | POA: Insufficient documentation

## 2017-02-25 DIAGNOSIS — F17210 Nicotine dependence, cigarettes, uncomplicated: Secondary | ICD-10-CM | POA: Insufficient documentation

## 2017-02-25 MED ORDER — PREDNISONE 20 MG PO TABS: 40.0000 mg | ORAL_TABLET | Freq: Every day | ORAL | 0 refills | Status: AC

## 2017-02-25 MED ORDER — IBUPROFEN 800 MG PO TABS
800.0000 mg | ORAL_TABLET | Freq: Once | ORAL | Status: AC
  Administered 2017-02-25: 800 mg via ORAL
  Filled 2017-02-25: qty 1

## 2017-02-25 MED ORDER — NAPROXEN 375 MG PO TABS
375.0000 mg | ORAL_TABLET | Freq: Two times a day (BID) | ORAL | 0 refills | Status: AC | PRN
Start: 2017-02-25 — End: ?

## 2017-02-25 MED ORDER — DEXAMETHASONE 4 MG PO TABS
12.0000 mg | ORAL_TABLET | Freq: Once | ORAL | Status: AC
  Administered 2017-02-25: 12 mg via ORAL
  Filled 2017-02-25: qty 3

## 2017-02-25 MED ORDER — OXYCODONE-ACETAMINOPHEN 5-325 MG PO TABS
1.0000 | ORAL_TABLET | Freq: Once | ORAL | Status: AC
  Administered 2017-02-25: 1 via ORAL
  Filled 2017-02-25: qty 1

## 2017-02-25 NOTE — ED Notes (Addendum)
Pt c/o right foot pain on the bottom of his heal went he is walking on it. No open or wound see at the site.

## 2017-02-25 NOTE — ED Triage Notes (Signed)
Pt reports R heel pain that began this am. Worse with weight bearing. No known injury. Pt has not walked more than usual or started wearing new shoes.

## 2017-03-04 NOTE — ED Provider Notes (Signed)
[facility]-EMERGENCY DEPT Provider Note   CSN: [id number] Arrival date & time: 02/25/17  2595     History   Chief Complaint Chief Complaint  Patient presents with  . Foot Pain    HPI Tyler Ryan is a 31 y.o. male.  HPI   30yM with atraumatic foot pain. Onset this morning. Persistent since. Denies increase in usual activity. Minimal pain at rest. Much worse with bearing weight. No swelling. No rash. Denies significant acute pain elsewhere.   Past Medical History:  Diagnosis Date  . Intermittent explosive disorder     Patient Active Problem List   Diagnosis Date Noted  . Wrist sprain, right, sequela 12/04/2016    History reviewed. No pertinent surgical history.     Home Medications    Prior to Admission medications   Medication Sig Start Date End Date Taking? Authorizing Provider  diclofenac (VOLTAREN) 50 MG EC tablet Take 1 tablet (50 mg total) by mouth 2 (two) times daily. 01/12/17   Janne Napoleon, NP  naproxen (NAPROSYN) 375 MG tablet Take 1 tablet (375 mg total) by mouth 2 (two) times daily as needed. 02/25/17   Raeford Razor, MD  predniSONE (DELTASONE) 20 MG tablet Take 2 tablets (40 mg total) by mouth daily. 02/25/17   Raeford Razor, MD    Family History Family History  Problem Relation Age of Onset  . Adopted: Yes    Social History Social History  Substance Use Topics  . Smoking status: Current Every Day Smoker    Packs/day: 0.50    Types: Cigarettes  . Smokeless tobacco: Current User    Types: Snuff  . Alcohol use Yes     Comment: occasionally     Allergies   Patient has no known allergies.   Review of Systems Review of Systems  All systems reviewed and negative, other than as noted in HPI.   All systems reviewed and negative, other than as noted in HPI. Physical Exam Updated Vital Signs BP 134/79 (BP Location: Left Arm)   Pulse 72   Temp 98.3 F (36.8 C) (Oral)   Resp 16   SpO2 100%   Physical Exam  Constitutional: He appears  well-developed and well-nourished. No distress.  HENT:  Head: Normocephalic and atraumatic.  Eyes: Conjunctivae are normal. Right eye exhibits no discharge. Left eye exhibits no discharge.  Neck: Neck supple.  Cardiovascular: Normal rate, regular rhythm and normal heart sounds.  Exam reveals no gallop and no friction rub.   No murmur heard. Pulmonary/Chest: Effort normal and breath sounds normal. No respiratory distress.  Abdominal: Soft. He exhibits no distension. There is no tenderness.  Musculoskeletal: He exhibits no edema or tenderness.  TTP plantar R foot towards heel. No swelling. No concerning skin changes. NVI.   Neurological: He is alert.  Skin: Skin is warm and dry.  Psychiatric: He has a normal mood and affect. His behavior is normal. Thought content normal.  Nursing note and vitals reviewed.    ED Treatments / Results  Labs (all labs ordered are listed, but only abnormal results are displayed) Labs Reviewed - No data to display  EKG  EKG Interpretation None       Radiology No results found.  Procedures Procedures (including critical care time)  Medications Ordered in ED Medications  ibuprofen (ADVIL,MOTRIN) tablet 800 mg (800 mg Oral Given 02/25/17 0928)  dexamethasone (DECADRON) tablet 12 mg (12 mg Oral Given 02/25/17 0928)  oxyCODONE-acetaminophen (PERCOCET/ROXICET) 5-325 MG per tablet 1 tablet (1 tablet Oral Given  02/25/17 [id number])     Initial Impression / Assessment and Plan / ED Course  I have reviewed the triage vital signs and the nursing notes.  Pertinent labs & imaging results that were available during my care of the patient were reviewed by me and considered in my medical decision making (see chart for details).    30yM with atraumatic foot pain. Possibly plantar fasciitis. Doubt DVT, infectious, etc.   Final Clinical Impressions(s) / ED Diagnoses   Final diagnoses:  Foot pain, right    New Prescriptions Discharge Medication List as of  02/25/2017  8:59 AM    START taking these medications   Details  naproxen (NAPROSYN) 375 MG tablet Take 1 tablet (375 mg total) by mouth 2 (two) times daily as needed., Starting Sun 02/25/2017, Print    predniSONE (DELTASONE) 20 MG tablet Take 2 tablets (40 mg total) by mouth daily., Starting Sun 02/25/2017, Print         Raeford RazorKohut, Ari Bernabei, MD 03/04/17 1452

## 2017-03-11 ENCOUNTER — Encounter (HOSPITAL_COMMUNITY): Payer: Self-pay | Admitting: Oncology

## 2017-03-11 ENCOUNTER — Emergency Department (HOSPITAL_COMMUNITY): Payer: Self-pay

## 2017-03-11 ENCOUNTER — Emergency Department (HOSPITAL_COMMUNITY)
Admission: EM | Admit: 2017-03-11 | Discharge: 2017-03-11 | Disposition: A | Discharge status: Home / Self Care | Payer: Self-pay | Attending: Emergency Medicine | Admitting: Emergency Medicine

## 2017-03-11 DIAGNOSIS — M25511 Pain in right shoulder: Secondary | ICD-10-CM | POA: Insufficient documentation

## 2017-03-11 DIAGNOSIS — F17210 Nicotine dependence, cigarettes, uncomplicated: Secondary | ICD-10-CM | POA: Insufficient documentation

## 2017-03-11 DIAGNOSIS — Z79899 Other long term (current) drug therapy: Secondary | ICD-10-CM | POA: Insufficient documentation

## 2017-03-11 MED ORDER — MELOXICAM 15 MG PO TABS: 15.0000 mg | ORAL_TABLET | Freq: Every day | ORAL | 0 refills | Status: AC

## 2017-03-11 NOTE — ED Triage Notes (Signed)
Pt c/o right shoulder pain since last Tuesday.  Denies injury.  Pt states, "It has just hurt."  Pt rates pain 9/10, sharp in nature.  Pt also states, "It don't wanna hold no weight."

## 2017-03-11 NOTE — Discharge Instructions (Signed)
Ic your shoulder several times a day. Rest, avoid lifting anything greater than 1 pound. Take Mobic as prescribed for pain and inflammation. Follow-up with family doctor.

## 2017-03-11 NOTE — ED Provider Notes (Signed)
[facility]-EMERGENCY DEPT Provider Note   CSN: [id number] Arrival date & time: 03/11/17  0149     History   Chief Complaint Chief Complaint  Patient presents with  . Shoulder Pain    HPI Lance CoonRobert Debarr is a 31 y.o. male.  HPI Lance CoonRobert Sebring is a 31 y.o. male presents to ED with right shoulder pain. Pt denies any injuries. States pain started 4 days ago. Pain is sharp, worse with movement of the shoulder, better with holding it still. Pt states he is unable to pick anything heavy. 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 Denies numbness or weakness distally. No other complaints. No fever chills. Denies IV drug use. Patient has been taking the Prevacid which has not helped.  Past Medical History:  Diagnosis Date  . Intermittent explosive disorder     Patient Active Problem List   Diagnosis Date Noted  . Wrist sprain, right, sequela 12/04/2016    History reviewed. No pertinent surgical history.     Home Medications    Prior to Admission medications   Medication Sig Start Date End Date Taking? Authorizing Provider  diclofenac (VOLTAREN) 50 MG EC tablet Take 1 tablet (50 mg total) by mouth 2 (two) times daily. 01/12/17   Janne NapoleonNeese, Hope M, NP  naproxen (NAPROSYN) 375 MG tablet Take 1 tablet (375 mg total) by mouth 2 (two) times daily as needed. 02/25/17   Raeford RazorKohut, Stephen, MD  predniSONE (DELTASONE) 20 MG tablet Take 2 tablets (40 mg total) by mouth daily. 02/25/17   Raeford RazorKohut, Stephen, MD    Family History Family History  Problem Relation Age of Onset  . Adopted: Yes    Social History Social History  Substance Use Topics  . Smoking status: Current Every Day Smoker    Packs/day: 0.50    Types: Cigarettes  . Smokeless tobacco: Current User    Types: Snuff  . Alcohol use Yes     Comment: occasionally     Allergies   Patient has no known allergies.   Review of Systems Review of Systems  Constitutional: Negative for chills and fever.    Musculoskeletal: Positive for arthralgias. Negative for joint swelling.  Neurological: Negative for weakness and numbness.     Physical Exam Updated Vital Signs BP (!) 149/90 (BP Location: Left Arm)   Pulse 86   Temp 98.6 F (37 C) (Oral)   Resp 18   Ht 5\' 11"  (1.803 m)   Wt 170 lb (77.1 kg)   SpO2 100%   BMI 23.71 kg/m   Physical Exam  Constitutional: He appears well-developed and well-nourished. No distress.  Eyes: Conjunctivae are normal.  Neck: Neck supple.  Cardiovascular: Normal rate and intact distal pulses.   Pulmonary/Chest: No respiratory distress.  Abdominal: He exhibits no distension.  Musculoskeletal:  Normal-appearing right shoulder with no erythema, swelling, bruising, deformity. Tenderness to palpation over lateral shoulder joint and before meals joint. Full range of motion actively and passively of the shoulder. Negative arm drop test. Pain with external rotation of the shoulder.  Skin: Skin is warm and dry.  Nursing note and vitals reviewed.    ED Treatments / Results  Labs (all labs ordered are listed, but only abnormal results are displayed) Labs Reviewed - No data to display  EKG  EKG Interpretation None       Radiology Dg Shoulder Right  Result Date: 03/11/2017 CLINICAL DATA:  Right shoulder pain. No known injury. Unable to use arm. EXAM:  RIGHT SHOULDER - 2+ VIEW COMPARISON:  Radiographs 02/11/2014 FINDINGS: There is no evidence of fracture or dislocation. There is no evidence of arthropathy or other focal bone abnormality. Soft tissues are unremarkable. IMPRESSION: Negative radiographs of the right shoulder. Electronically Signed   By: Rubye Oaks M.D.   On: 03/11/2017 03:10    Procedures Procedures (including critical care time)  Medications Ordered in ED Medications - No data to display   Initial Impression / Assessment and Plan / ED Course  I have reviewed the triage vital signs and the nursing notes.  Pertinent labs &  imaging results that were available during my care of the patient were reviewed by me and considered in my medical decision making (see chart for details).     Patient with right shoulder pain, and denies injury. Neurovascular intact. X-rays negative. Patient does a lot of heavy lifting and repetitive motion, most likely tendinitis versus impingement syndrome. Joint is stable. No signs of infection. Plan to discharge home, NSAIDs, rest, follow-up with orthopedics. Patient refused a sling.  Vitals:   03/11/17 0158 03/11/17 0231  BP: (!) 149/90   Pulse: 86   Resp: 18   Temp: 98.6 F (37 C)   TempSrc: Oral   SpO2: 100%   Weight:  170 lb (77.1 kg)  Height:  5\' 11"  (1.803 m)     Final Clinical Impressions(s) / ED Diagnoses   Final diagnoses:  Acute pain of right shoulder    New Prescriptions New Prescriptions   MELOXICAM (MOBIC) 15 MG TABLET    Take 1 tablet (15 mg total) by mouth daily.     Jaynie Crumble, PA-C 03/11/17 1610    Zadie Rhine, MD 03/11/17 [phone]

## 2018-06-08 IMAGING — CR DG WRIST COMPLETE 3+V*R*
4 series · 4 of 4 positions shown · non-contrast
Comparison: None.

CLINICAL DATA: Right wrist pain. Twisted right wrist while drilling
concrete

EXAM:
RIGHT WRIST - COMPLETE 3+ VIEW

[x wrist pa right]
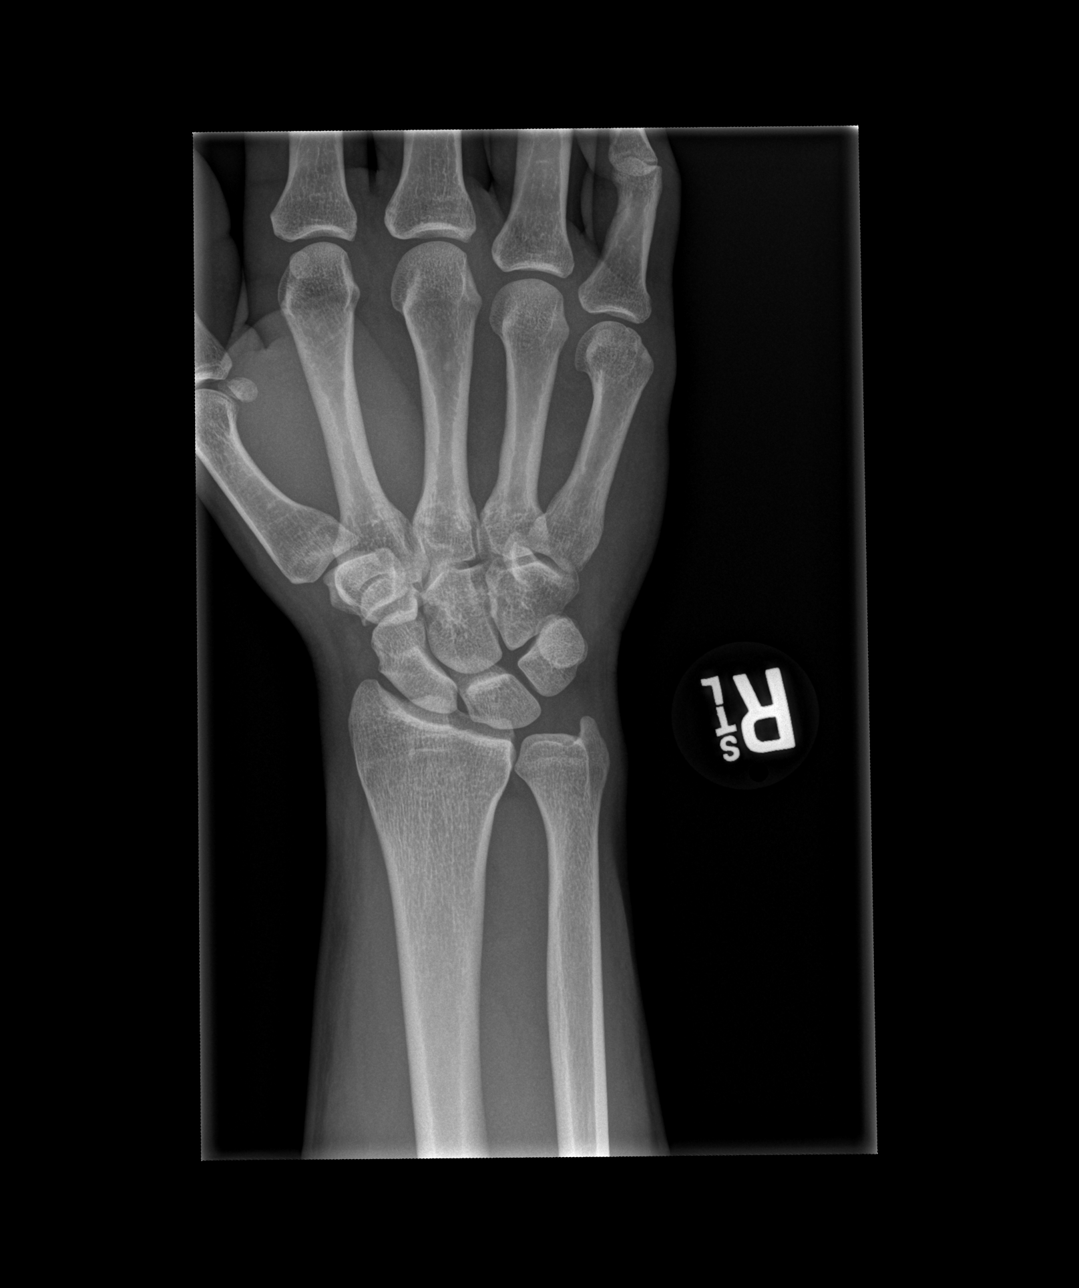

[x wrist obl right]
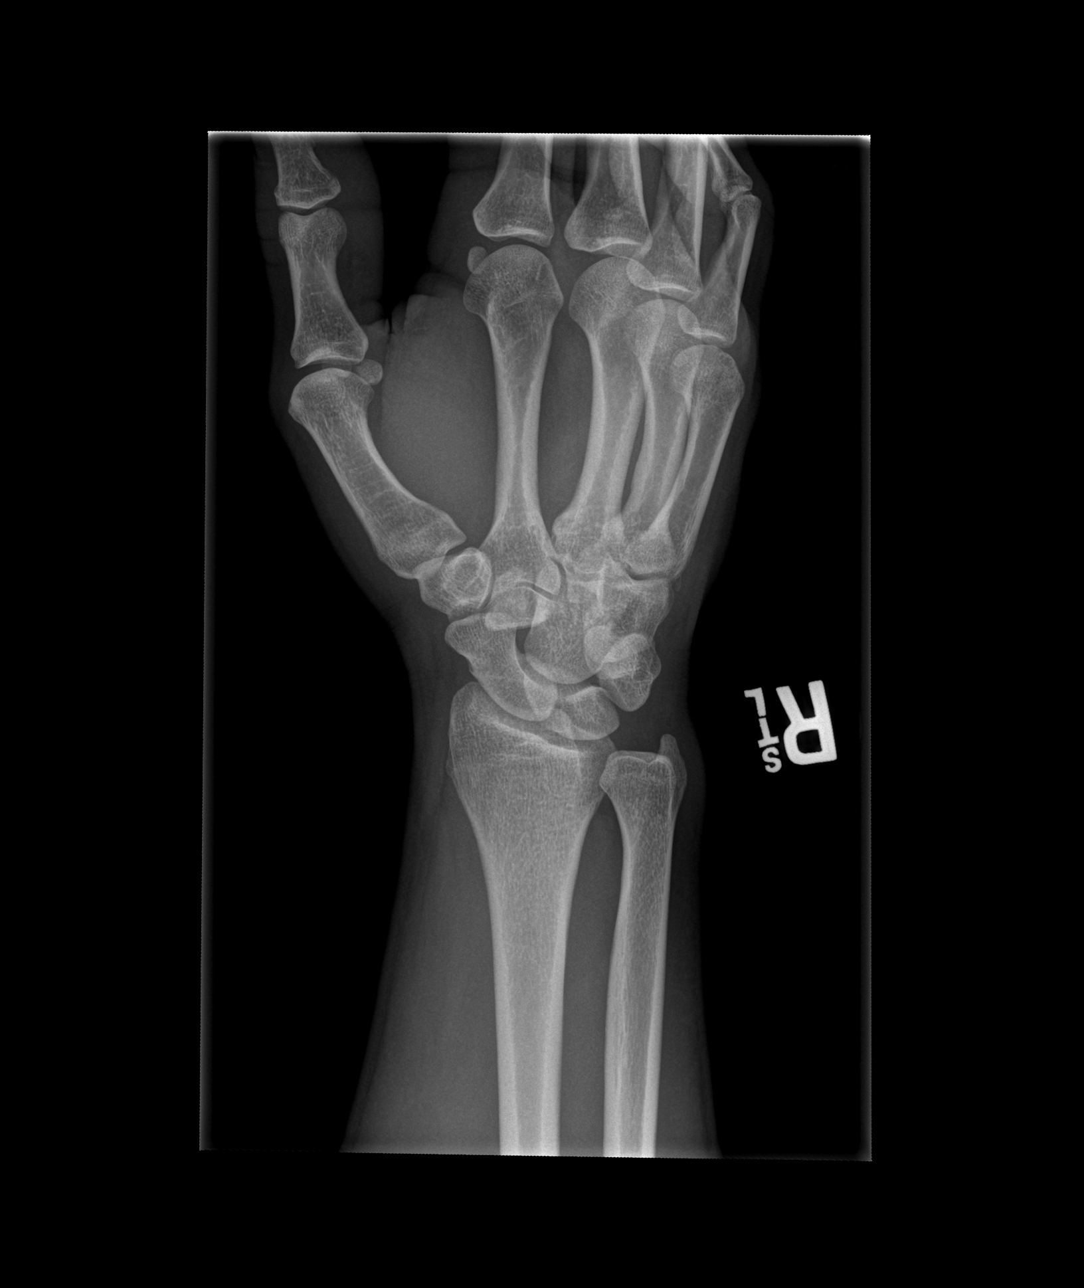

[x wrist lat right]
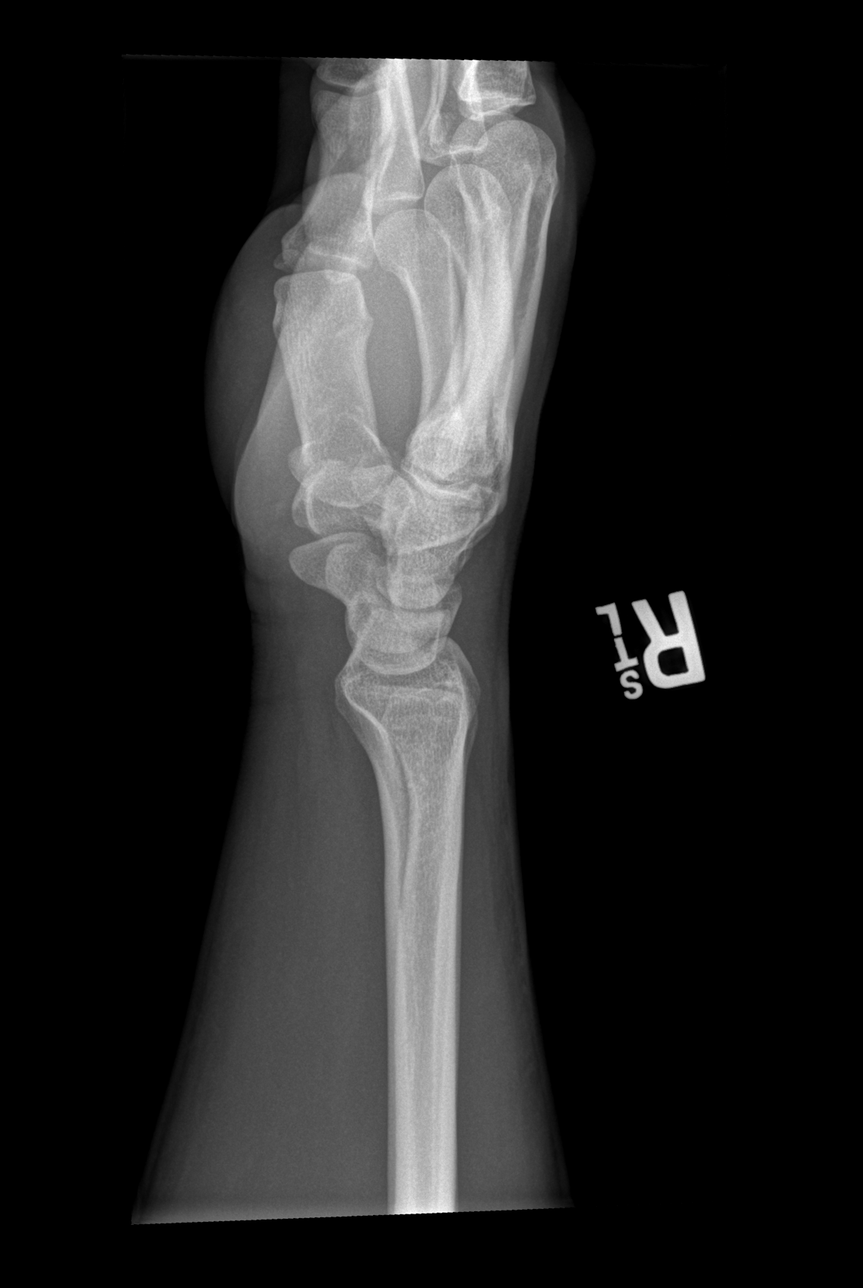

[x wrist navicular view right]
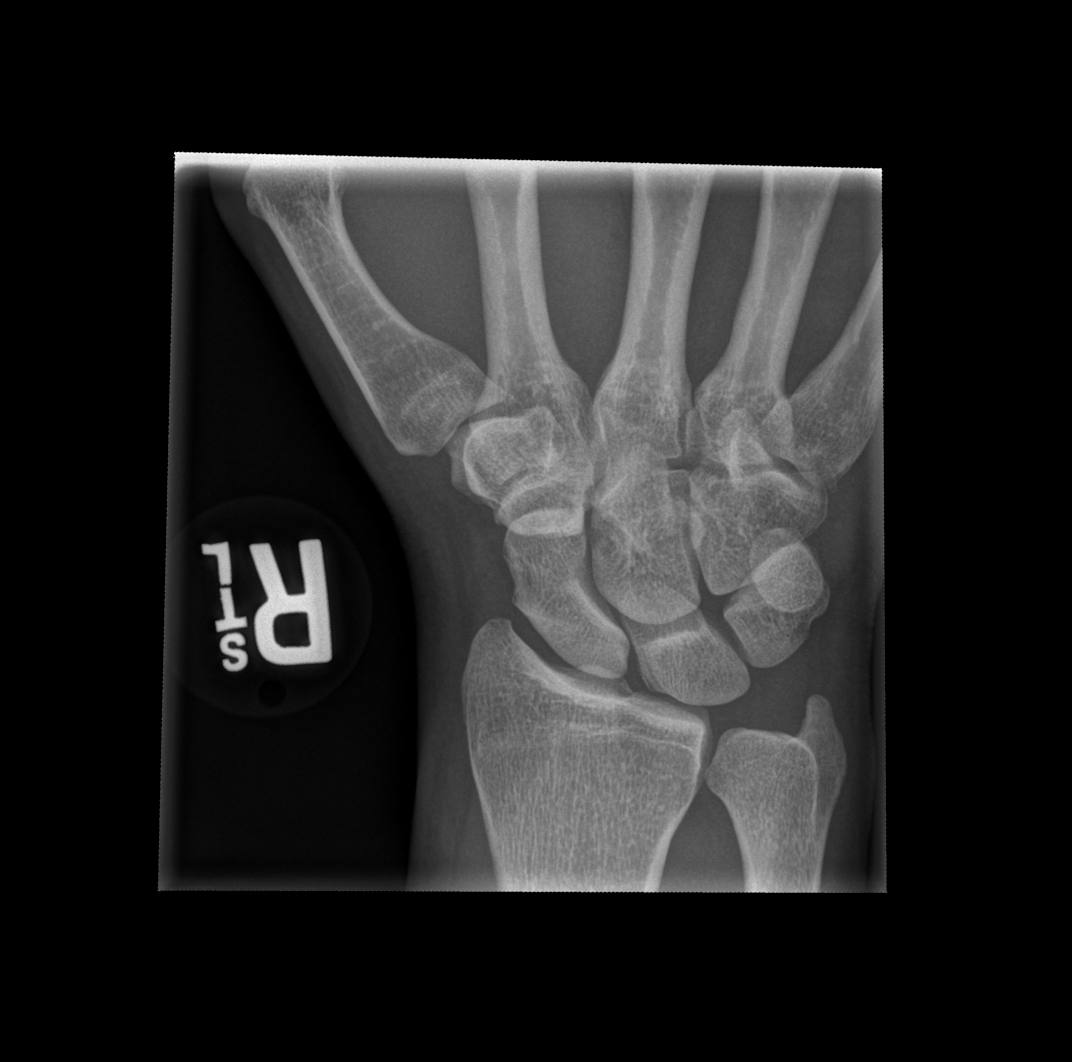

[4 of 4 positions shown; findings below may reference images not displayed]

FINDINGS: There is no evidence of fracture or dislocation. There is no
evidence of arthropathy or other focal bone abnormality. Soft
tissues are unremarkable.
IMPRESSION: Negative.
